# Patient Record
Sex: Female | Born: 1960 | Race: White | Hispanic: No | Smoking: Current every day smoker
Health system: Southern US, Community
[De-identification: ages and names within clinical notes are randomized; demographics above are authoritative.]

## PROBLEM LIST (undated history)

## (undated) DIAGNOSIS — I7300 Raynaud's syndrome without gangrene: Secondary | ICD-10-CM

## (undated) DIAGNOSIS — Z720 Tobacco use: Secondary | ICD-10-CM

## (undated) HISTORY — DX: Raynaud's syndrome without gangrene: I73.00

## (undated) HISTORY — DX: Tobacco use: Z72.0

---

## 1999-10-07 ENCOUNTER — Encounter: Payer: Self-pay | Admitting: Emergency Medicine

## 1999-10-07 ENCOUNTER — Emergency Department (HOSPITAL_COMMUNITY): Admission: EM | Admit: 1999-10-07 | Discharge: 1999-10-07 | Payer: Self-pay | Admitting: Emergency Medicine

## 2000-05-08 ENCOUNTER — Other Ambulatory Visit: Admission: RE | Admit: 2000-05-08 | Discharge: 2000-05-08 | Payer: Self-pay | Admitting: Obstetrics and Gynecology

## 2003-03-28 ENCOUNTER — Other Ambulatory Visit: Admission: RE | Admit: 2003-03-28 | Discharge: 2003-03-28 | Payer: Self-pay | Admitting: Obstetrics and Gynecology

## 2004-03-04 ENCOUNTER — Emergency Department (HOSPITAL_COMMUNITY): Admission: EM | Admit: 2004-03-04 | Discharge: 2004-03-04 | Payer: Self-pay | Admitting: Emergency Medicine

## 2006-10-01 ENCOUNTER — Emergency Department (HOSPITAL_COMMUNITY): Admission: EM | Admit: 2006-10-01 | Discharge: 2006-10-01 | Payer: Self-pay | Admitting: Emergency Medicine

## 2008-11-02 ENCOUNTER — Ambulatory Visit: Payer: Self-pay | Admitting: Psychiatry

## 2008-11-02 ENCOUNTER — Inpatient Hospital Stay (HOSPITAL_COMMUNITY): Admission: AD | Admit: 2008-11-02 | Discharge: 2008-11-07 | Payer: Self-pay | Admitting: Psychiatry

## 2010-10-17 LAB — URINALYSIS, ROUTINE W REFLEX MICROSCOPIC
Ketones, ur: NEGATIVE mg/dL
Nitrite: NEGATIVE
Protein, ur: NEGATIVE mg/dL
Specific Gravity, Urine: 1.018 (ref 1.005–1.030)

## 2010-10-17 LAB — COMPREHENSIVE METABOLIC PANEL
ALT: <8 U/L (ref 0–35)
AST: 16 U/L (ref 0–37)
Alkaline Phosphatase: 48 U/L (ref 39–117)
Calcium: 9.4 mg/dL (ref 8.4–10.5)
GFR calc Af Amer: >60 mL/min (ref >60)
Potassium: 4.2 mEq/L (ref 3.5–5.1)
Sodium: 137 mEq/L (ref 135–145)
Total Protein: 6.5 g/dL (ref 6.0–8.3)

## 2010-10-17 LAB — DRUGS OF ABUSE SCREEN W/O ALC, ROUTINE URINE
Barbiturate Quant, Ur: NEGATIVE
Benzodiazepines.: POSITIVE — AB
Cocaine Metabolites: NEGATIVE
Methadone: NEGATIVE
Opiate Screen, Urine: NEGATIVE

## 2010-10-17 LAB — CBC
MCHC: 33.8 g/dL (ref 30.0–36.0)
RBC: 4.66 MIL/uL (ref 3.87–5.11)
RDW: 13.6 % (ref 11.5–15.5)

## 2010-10-17 LAB — VITAMIN B12: Vitamin B-12: 450 pg/mL (ref 211–911)

## 2010-11-20 NOTE — H&P (Signed)
[name], [name]               ACCOUNT NO.:  [id number]   MEDICAL RECORD NO.:  [id number]          PATIENT TYPE:  IPS   LOCATION:  0503                          FACILITY:  [facility]   PHYSICIAN:  Geoffery Lyons, M.D.      DATE OF BIRTH:  [birth year]   DATE OF ADMISSION:  11/02/2008  DATE OF DISCHARGE:                       PSYCHIATRIC ADMISSION ASSESSMENT   TIME:  11:25 a.m.   IDENTIFYING INFORMATION:  This is a 50 year old female.  This is a  voluntary admission.   HISTORY OF PRESENT ILLNESS:  First inpatient psychiatric admission for  this 13 year old single mother, who reports having worsening depression  since around the Christmas holidays in 2009.  Then developing more  suicidal thoughts over the course of the past 2 weeks.  She has had  thoughts of possibly killing herself in a single car motor vehicle  accident and weighing the benefits and risks of other types of ways to  harm herself, but has made no attempts.  She has no history of prior  suicide attempts.  She feels that the onset of this depressive episode  occurred around December 2009 which was the 9 year anniversary of her  mother's death.  Since that time, she has been gradually feeling more  and more lonely with less energy.  Much more reclusive at home.  Appetite has been fairly good.  No weight loss, possibly a few pounds of  weight gain, but for the past month to 2 weeks not socializing, staying  at home all the time, very reclusive, no energy, lying in bed.  No  motivation to do anything.  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  She has been experiencing more anger and irritability lashing  out verbally at her 50 year old son and not able to tolerate minor  frustrations.  Suicidal thoughts now for 2 weeks.  No homicidal  thoughts.  No hallucinations.  She did relapse on cocaine one time about  6 days ago snorting powder after being  abstinent since May 2009.  Also  reports doing some binge drinking on the weekends in an attempt to  sedate herself.  Had her alprazolam prescription for 0.5 mg refilled and  is using 6-8 pills per day for the past 2-3 days when it is prescribed  up to q.i.d. p.r.n.   PAST PSYCHIATRIC HISTORY:  First inpatient psychiatric admission.  She  has a history of cannabis abuse daily for many years with intermittent  periods of abstinence usually up to about 30-45 days and then she will  resume use.  Had used cocaine and has been abstinent since Memorial Day  2009 until this past Friday about 6 days ago.  No history of suicide  attempts.  History of depression with onset 9 years ago when her mother  died and was treated at that time with either Zoloft or Wellbutrin.  She  is unsure.  No other psychotropics.  No history of suicide attempts.  No  prior admissions.  No prior treatment for substance abuse.   SOCIAL  HISTORY:  Divorced female with a 52 year old son at home.  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  Son's father is  involved in his life, but she receives no financial support for caring  for him.  Has no health insurance and does have significant financial  stressors.  No legal problems.   FAMILY HISTORY:  Mother and grandmother with undiagnosed and untreated  depression.   MEDICAL HISTORY:  Medical problems are none.   PAST MEDICAL HISTORY:  1. C. section.  2. Tonsillectomy and adenoidectomy.  3. Previously diagnosed with Raynaud's syndrome, but never treated.  4. No history of seizures, blackouts, brain injury or memory loss.   CURRENT MEDICATIONS:  Alprazolam 0.5 mg up to q.i.d. p.r.n.   DRUG ALLERGIES:  NONE.   PHYSICAL EXAMINATION:  GENERAL:  Physical exam was done here on the  unit.  It is unremarkable.  This is a medium built female, 5 feet 7  inches tall, 68 kg.  VITAL SIGNS:  Temperature 96.8, pulse 79, blood pressure 117/85,  respirations 16 and  clear.  MOTOR:  Within normal limits.  NEURO:  Nonfocal.   DIAGNOSTICS:  Pending.   MENTAL STATUS EXAM:  Reveals a fully alert female who does appear sad  with a constricted affect, good eye contact, tearful while talking about  her recent stressors and the loss of her mom.  Some guilt and shame  about being short-tempered with her son.  Insight is good.  Is able to  describe and relate her symptoms.  Gives a coherent history.  Thinking  is logical and coherent.  Mood is depressed.  No evidence of internal  distraction.  No signs of confusion or delirium.  Cognition is fully  preserved.  Insight good.  Impulse control and judgment within normal  limits.  Positive suicidal thoughts.  No homicidal thoughts.   AXIS I:  Major depressive episode, recurrent, severe.  Polysubstance  abuse.  AXIS II:  Deferred.  AXIS III:  No diagnosis.  AXIS IV:  Moderate chronic financial stressors and single parenting  stress.  AXIS V:  Current 48, past year not known.   PLAN:  The plan is to voluntarily admit her.  She recognizes that she  has been using the Xanax to sedate herself and doing some binge  drinking, so we will detox her with a Librium protocol, get some basic  labs and we will go from there.  She is enrolled in our dual diagnosis  program.      Claris Che A. Scott, N.P.      Geoffery Lyons, M.D.  Electronically Signed    MAS/MEDQ  D:  11/02/2008  T:  11/02/2008  Job:  [id number]

## 2010-11-23 NOTE — Discharge Summary (Signed)
NAMECHESNEY, SUARES               ACCOUNT NO.:  [id number]   MEDICAL RECORD NO.:  [id number]          PATIENT TYPE:  IPS   LOCATION:  0500                          FACILITY:  [facility]   PHYSICIAN:  Geoffery Lyons, M.D.      DATE OF BIRTH:  [birth year]   DATE OF ADMISSION:  11/02/2008  DATE OF DISCHARGE:  11/07/2008                               DISCHARGE SUMMARY   CHIEF COMPLAINT/HISTORY OF PRESENT ILLNESS:  This for the first  admission to [facility] for this 50 year old  female.  Reports worsening of the depression since Christmas with more  suicidal thoughts over the course of the past two weeks, with thoughts  of killing herself in a single car motor vehicle accident, and weighing  the benefits and risks for further types of ways to harm herself.  Has  made no attempts.  No previous suicide attempts.  Felt at the onset of  this depressive episode occurring in December 2009, which was the 9th  anniversary of her mother's death.  Since then she has been feeling more  and more lonely, with less energy, more reclusive.  Her appetite has  been holding.  The last two weeks, no socializing, isolating, with no  energy, lying in bed.  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  More anger,  irritability and lashing out verbally at her 69 year old son. Suicidal  thoughts for the last two weeks.  Did relapse on cocaine six days prior  to this admission, after being abstinent since May 2009.  Some binge  drinking on the weekends.   PAST PSYCHIATRIC HISTORY:  First time inpatient.  History of marijuana  abuse.  More recent use of cocaine, abstinent May 2009.  History of  depression, onset nine years ago, when her mother died.  At that time  treated with Zoloft or Wellbutrin.   PAST MEDICAL HISTORY:  Diagnosed with Raynaud's, never treated.   MEDICATIONS:  1. Xanax 0.5 mg, up to four times daily as needed.   PHYSICAL  EXAMINATION:  GENERAL:  Failed to show any acute findings.   LABORATORY WORK:  White blood cells 10.0, hemoglobin 14.6.  Sodium 137,  potassium 4.2, glucose 87.  SGOT 3.6, SGPT 16, total bilirubin 0.6.  TSH  2.374.  Drug screen positive for benzodiazepines.   MENTAL STATUS EXAM:  Reveals an alert, cooperative female.  Mood  depressed.  Affect depressed.  Good eye contact.  Tearful when talking  about her recent stressors and loss of her mother, and some guilt and  shame about being short-tempered with her son.  Thought processes  logical, coherent and relevant.  No active suicidal or homicidal ideas.  No delusions, no hallucinations.  Cognition well-preserved.   ADMISSION DIAGNOSES:  AXIS I:  1.  Major depressive disorder.  1. Cocaine abuse.  2. Marijuana abuse.  3. Alcohol abuse.  AXIS II:  No diagnosis.  AXIS IV:  Moderate.  On admission global assessment of functioning 35,  global assessment of functioning in the last year 60.  COURSE IN THE HOSPITAL:  She was admitted and started on individual and  group psychotherapy.  We detoxed with Librium.  We started Zoloft, which  she thought was the medication that she used successfully in the past.  Was wanting to give it a try.  Did endorse the underlying depression on  and off and the more recent suicidal ruminations.  Never in counseling  before.  There was a family history of depression.  The patient has  continued to open up and share all the series of events that resulted in  her being admitted.  Had a call  from the ex-husband, who was  supportive.  She was surprised, as in the past he has not been  supportive of her.  She did not want to read too much into it, as she  stated caring about him and getting back together will be something that  she would really like.  So we pursued the Zoloft.  We pursue coping  skills, cognitive behavioral therapy and sleeping through the night.  On  11/07/2008, she was in full contact with  reality.  There were no active  suicidal ideas, no hallucinations or delusions.   She was ready to go home.  She was committed to abstinence, to continue  to work on an outpatient basis and to continue to take the medications.   DISCHARGE DIAGNOSES:  AXIS I:  1.  Major depressive disorder.  1. Marijuana, cocaine and alcohol abuse.  AXIS II:  No diagnosis.  AXIS III:  No diagnosis.  AXIS IV:  Moderate.  On discharge global assessment of functioning was  55 to 60.   DISCHARGE MEDICATIONS:  1. Discharged on Zoloft 50 mg, one in the morning.  2. Ambien 10 mg, 1/2 to one at night for sleep.  3. Librium 25 mg, one daily for seven days, then discontinue.   FOLLOWUP:  To follow up with Dr. Lang Snow at [facility] and Malissa Hippo at [facility].      Geoffery Lyons, M.D.  Electronically Signed     IL/MEDQ  D:  11/18/2008  T:  11/18/2008  Job:  [id number]

## 2012-03-05 ENCOUNTER — Encounter (HOSPITAL_COMMUNITY): Payer: Self-pay

## 2012-03-05 ENCOUNTER — Emergency Department (HOSPITAL_COMMUNITY)
Admission: EM | Admit: 2012-03-05 | Discharge: 2012-03-06 | Disposition: A | Discharge status: Home / Self Care | Payer: Self-pay | Attending: Emergency Medicine | Admitting: Emergency Medicine

## 2012-03-05 ENCOUNTER — Emergency Department (HOSPITAL_COMMUNITY): Payer: Self-pay

## 2012-03-05 DIAGNOSIS — F17200 Nicotine dependence, unspecified, uncomplicated: Secondary | ICD-10-CM | POA: Insufficient documentation

## 2012-03-05 DIAGNOSIS — R109 Unspecified abdominal pain: Secondary | ICD-10-CM

## 2012-03-05 DIAGNOSIS — R1031 Right lower quadrant pain: Secondary | ICD-10-CM | POA: Insufficient documentation

## 2012-03-05 LAB — COMPREHENSIVE METABOLIC PANEL
ALT: 8 U/L (ref 0–35)
AST: 19 U/L (ref 0–37)
Albumin: 3.9 g/dL (ref 3.5–5.2)
Alkaline Phosphatase: 64 U/L (ref 39–117)
Chloride: 101 mEq/L (ref 96–112)
Creatinine, Ser: 1.08 mg/dL (ref 0.50–1.10)
Potassium: 4.2 mEq/L (ref 3.5–5.1)
Sodium: 138 mEq/L (ref 135–145)
Total Bilirubin: 0.4 mg/dL (ref 0.3–1.2)

## 2012-03-05 LAB — URINALYSIS, ROUTINE W REFLEX MICROSCOPIC
Glucose, UA: NEGATIVE mg/dL
Leukocytes, UA: NEGATIVE
Specific Gravity, Urine: 1.018 (ref 1.005–1.030)
pH: 6.5 (ref 5.0–8.0)

## 2012-03-05 LAB — CBC WITH DIFFERENTIAL/PLATELET
Eosinophils Absolute: 0.2 10*3/uL (ref 0.0–0.7)
Eosinophils Relative: 2 % (ref 0–5)
Lymphs Abs: 3.1 10*3/uL (ref 0.7–4.0)
MCH: 32.1 pg (ref 26.0–34.0)
MCV: 92.8 fL (ref 78.0–100.0)
Monocytes Relative: 7 % (ref 3–12)
Platelets: 250 10*3/uL (ref 150–400)
RBC: 4.45 MIL/uL (ref 3.87–5.11)

## 2012-03-05 LAB — PREGNANCY, URINE: Preg Test, Ur: NEGATIVE

## 2012-03-05 MED ORDER — HYDROCODONE-ACETAMINOPHEN 5-500 MG PO TABS: 1.0000 | ORAL_TABLET | Freq: Four times a day (QID) | ORAL | Status: AC | PRN

## 2012-03-05 MED ORDER — ONDANSETRON HCL 4 MG/2ML IJ SOLN
4.0000 mg | Freq: Once | INTRAMUSCULAR | Status: AC
  Administered 2012-03-05: 4 mg via INTRAVENOUS
  Filled 2012-03-05: qty 2

## 2012-03-05 MED ORDER — SODIUM CHLORIDE 0.9 % IV BOLUS (SEPSIS)
1000.0000 mL | Freq: Once | INTRAVENOUS | Status: AC
  Administered 2012-03-05: 1000 mL via INTRAVENOUS

## 2012-03-05 MED ORDER — IOHEXOL 300 MG/ML  SOLN
100.0000 mL | Freq: Once | INTRAMUSCULAR | Status: AC | PRN
  Administered 2012-03-05: 100 mL via INTRAVENOUS

## 2012-03-05 MED ORDER — HYDROMORPHONE HCL PF 1 MG/ML IJ SOLN
1.0000 mg | Freq: Once | INTRAMUSCULAR | Status: AC
  Administered 2012-03-05: 1 mg via INTRAVENOUS
  Filled 2012-03-05: qty 1

## 2012-03-05 NOTE — ED Notes (Signed)
Pt c/o pain at RLQ pain, went to OB/GYN and didn't find anything with Ultrasound, referred here for rule out appendicitis. Pt alert, oriented.

## 2012-03-05 NOTE — ED Provider Notes (Signed)
History     CSN: [id number]  Arrival date & time 03/05/12  1642   First MD Initiated Contact with Patient 03/05/12 2015      Chief Complaint  Patient presents with  . Abdominal Pain    RLQ    (Consider location/radiation/quality/duration/timing/severity/associated sxs/prior treatment) HPI Pt presents with right lower abdominal pain.  She states pain is constant and worsening over the past several days.  No fever or vomiting.  Pain described as soreness, worse when she moves.  She was seen today prior to this ED visit by her gynecologist and had a pelvic exam and pelvic ultrasound.  They did not find any abnormalities.  She denies dysuria, no changes in stools.  There are no other associated systemic symptoms, there are no other alleviating or modifying factors.   History reviewed. No pertinent past medical history.  Past Surgical History  Procedure Date  . Cesarean section     No family history on file.  History  Substance Use Topics  . Smoking status: Current Everyday Smoker -- 1.0 packs/day  . Smokeless tobacco: Not on file  . Alcohol Use: Yes    OB History    Grav Para Term Preterm Abortions TAB SAB Ect Mult Living                  Review of Systems ROS reviewed and all otherwise negative except for mentioned in HPI  Allergies  Review of patient's allergies indicates no known allergies.  Home Medications   Current Outpatient Rx  Name Route Sig Dispense Refill  . ACETAMINOPHEN 500 MG PO TABS Oral Take 1,000 mg by mouth every 6 (six) hours as needed. pain    . BUPROPION HCL 100 MG PO TABS Oral Take 200 mg by mouth daily.    . IBUPROFEN 200 MG PO TABS Oral Take 600 mg by mouth every 6 (six) hours as needed. pain    . ADULT MULTIVITAMIN W/MINERALS CH Oral Take 1 tablet by mouth daily.    . SERTRALINE HCL 100 MG PO TABS Oral Take 200 mg by mouth daily.    Marland Kitchen HYDROCODONE-ACETAMINOPHEN 5-500 MG PO TABS Oral Take 1-2 tablets by mouth every 6 (six) hours as needed for  pain. 15 tablet 0    BP 117/69  Pulse 78  Temp 97.8 F (36.6 C) (Oral)  Resp 16  SpO2 95% Vitals reviewed Physical Exam Physical Examination: General appearance - alert, well appearing, and in no distress Mental status - alert, oriented to person, place, and time Eyes - no scleral icterus or conjunctival injection Mouth - mucous membranes moist, pharynx normal without lesions Chest - clear to auscultation, no wheezes, rales or rhonchi, symmetric air entry Heart - normal rate, regular rhythm, normal S1, S2, no murmurs, rubs, clicks or gallops Abdomen - soft, RLQ tenderness- no gaurding or rebound, nondistended, no masses or organomegaly, nabs Back exam - full range of motion, no tenderness, palpable spasm or pain on motion, no CVA tenderness Extremities - peripheral pulses normal, no pedal edema, no clubbing or cyanosis Skin - normal coloration and turgor, no rashes  ED Course  Procedures (including critical care time)  Labs Reviewed  COMPREHENSIVE METABOLIC PANEL - Abnormal; Notable for the following:    GFR calc non Af Amer 59 (*)     GFR calc Af Amer 68 (*)     All other components within normal limits  URINALYSIS, ROUTINE W REFLEX MICROSCOPIC  PREGNANCY, URINE  CBC WITH DIFFERENTIAL  LAB REPORT -  SCANNED   No results found.   1. Abdominal pain       MDM  Pt presenting with c/o right lower abdominal pain.  States she was seen at gynecologist today- had pelvic exam and pelvic ultrasound in their office- was referred to ED for appendicitis rule out.  No RBCs in urine to suggest renal stone, CT scan shows normal appendix.  No finding to explain RLQ pain.  Pt advised of findings in liver- recommended RUQ ultrasound.  Discharged with strict return precautions.  Pt agreeable with plan.        Ethelda Chick, MD 03/09/12 1750

## 2012-03-05 NOTE — ED Notes (Signed)
Ct called, informed them pt finished contrast.

## 2012-03-05 NOTE — ED Notes (Signed)
Pt ambulated back from CT.

## 2012-03-05 NOTE — ED Notes (Signed)
Pt reports RLQ pain. Pt states she went to her

## 2012-03-05 NOTE — ED Notes (Signed)
Pt ambulated to CT

## 2012-03-12 ENCOUNTER — Other Ambulatory Visit: Payer: Self-pay | Admitting: Family Medicine

## 2012-03-12 DIAGNOSIS — D1803 Hemangioma of intra-abdominal structures: Secondary | ICD-10-CM

## 2012-03-13 ENCOUNTER — Other Ambulatory Visit: Payer: Self-pay

## 2014-09-14 ENCOUNTER — Ambulatory Visit (INDEPENDENT_AMBULATORY_CARE_PROVIDER_SITE_OTHER): Payer: Managed Care, Other (non HMO)

## 2014-09-14 ENCOUNTER — Encounter: Payer: Self-pay | Admitting: Podiatry

## 2014-09-14 ENCOUNTER — Ambulatory Visit (INDEPENDENT_AMBULATORY_CARE_PROVIDER_SITE_OTHER): Payer: Managed Care, Other (non HMO) | Admitting: Podiatry

## 2014-09-14 VITALS — BP 96/61 | HR 47 | Resp 18

## 2014-09-14 DIAGNOSIS — Q6689 Other  specified congenital deformities of feet: Secondary | ICD-10-CM

## 2014-09-14 DIAGNOSIS — M7740 Metatarsalgia, unspecified foot: Secondary | ICD-10-CM

## 2014-09-14 DIAGNOSIS — Q72899 Other reduction defects of unspecified lower limb: Secondary | ICD-10-CM

## 2014-09-14 DIAGNOSIS — R52 Pain, unspecified: Secondary | ICD-10-CM

## 2014-09-14 DIAGNOSIS — B351 Tinea unguium: Secondary | ICD-10-CM

## 2014-09-14 DIAGNOSIS — M2010 Hallux valgus (acquired), unspecified foot: Secondary | ICD-10-CM

## 2014-09-14 NOTE — Progress Notes (Signed)
   Subjective:    Patient ID: [name], female    DOB: November 02, 1960, 54 y.o.   MRN: [id number]  HPI  54 year old female presents the office today with complaints of bilateral bunions, pain in the ball of her foot as well as intermittent numbness to her toes. She states that she stands on concrete floors working 8 hours per day wearing steel toed shoes. She states that she has pain after standing for prolonged periods of time. She denies any recent injury or trauma to her lower extremities and she denies any swelling or redness overlying the area. She also states that the nails need to be trimmed as they are thickened discolored and she is inquiring about possible nail fungus. She states the nails and not painful and she denies any redness or drainage from around the sites. She's had no other treatments for her foot conditions. No other complaints at this time.   Review of Systems  Respiratory: Positive for cough.   Endocrine: Positive for cold intolerance.  Musculoskeletal:       Joint pain  Skin: Positive for rash.  All other systems reviewed and are negative.      Objective:   Physical Exam AAO 3, NAD DP/PT pulses palpable, CRT less than 3 seconds Protective sensation intact with Simms Weinstein monofilament, vibratory sensation intact, Achilles tendon reflex intact. There is a decrease in medial arch height upon weightbearing bilaterally. 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 There is plantarflexed first metatarsals bilaterally. There is prominent metatarsal heads plantarly with atrophy of the fat pad. On the right foot there is significant prominence of the third and fifth metatarsal heads due to the shortened fourth metatarsal and there is a hyperkeratotic lesion overlying the second and third metatarsal heads on the right foot. There is no area pinpoint bony tenderness or pain with vibratory sensation to bilateral lower extremities. There is no  pain with MTPJ range of motion or ankle/subtalar joint range of motion. There is no pain upon compression of the interdigital space and there is no palpable neuroma identified. No pain with medial to lateral compression of the metatarsals. MMT 5/5, ROM WNL. There is no overlying edema, erythema, increase in warmth to bilateral lower extremities. No open lesions or other pre-ulcerative lesions identified bilaterally. There is no evidence of athlete's foot at this time. Nails are slightly dystrophic, discolored and elongated. There is no tenderness to palpation along the nails there is no swelling erythema or drainage. No pain with calf compression, swelling, warmth, erythema.       Assessment & Plan:  54 year old female with bilateral forefoot pain, numbness -X-rays were obtained and reviewed with the patient. -Treatment options discussed the patient include alternatives, risks, complications. -Discussed the patient in detail likely etiology of her symptoms. Numbness is likely due to biomechanical in nature. I discussed various offloading devices and orthotics to help support her foot type. A prescription for orthotics given to the patient for biotech. -Metatarsal pads were dispensed to the patient and applied. -Hyperkeratotic lesion sharply debrided 1 without complication/bleeding. -Nail sharply debrided 10 without complication/bleeding. Discussed various treatment options for onychomycosis. -Follow-up after orthotics are made or if she is unable to get them. In the meantime encouraged to call the office with any questions, concerns, change in symptoms. Follow-up with PCP for other issues mentioned in the ROS.

## 2016-06-24 ENCOUNTER — Ambulatory Visit: Payer: Managed Care, Other (non HMO) | Admitting: Podiatry

## 2016-07-04 ENCOUNTER — Ambulatory Visit (INDEPENDENT_AMBULATORY_CARE_PROVIDER_SITE_OTHER): Payer: Managed Care, Other (non HMO) | Admitting: Podiatry

## 2016-07-04 ENCOUNTER — Encounter: Payer: Self-pay | Admitting: Podiatry

## 2016-07-04 ENCOUNTER — Ambulatory Visit (INDEPENDENT_AMBULATORY_CARE_PROVIDER_SITE_OTHER): Payer: Managed Care, Other (non HMO)

## 2016-07-04 DIAGNOSIS — I999 Unspecified disorder of circulatory system: Secondary | ICD-10-CM | POA: Diagnosis not present

## 2016-07-04 DIAGNOSIS — M79672 Pain in left foot: Secondary | ICD-10-CM | POA: Diagnosis not present

## 2016-07-04 NOTE — Progress Notes (Signed)
Subjective:     Patient ID: [name], female   DOB: Sep 07, 1960, 55 y.o.   MRN: [id number]  HPI patient states that she stubbed her second toe 10 months ago left and it continues to her and she's having discomfort when she tries to wear shoes and she also complains of cramping in her leg during the day and at night which is gradually gotten worse and she is a history of smoker approximately 1/2-1 pack per day   Review of Systems     Objective:   Physical Exam Neurological status was intact but I did have difficulty finding and measuring pulses bilateral both DP and PT. The second digit left is discolored and there is no active ulceration but it does appear to be traumatized and appears to have vascular issues with patient also having history of raynaulds    Assessment:     Traumatized second digit left with possibility that vascular disease as a compromising factor with this condition    Plan:     H&P x-ray reviewed and I'm sending her for vascular evaluations before initiating treatment. I did apply padding the second toe to try to reduce some of the pressure against it and we are scheduling her for vascular evaluation  X-ray report was negative for signs of fracture did indicate structural bunion with no indications of trauma to the second digit

## 2016-07-23 ENCOUNTER — Ambulatory Visit: Payer: Self-pay | Admitting: Cardiovascular Disease

## 2016-07-30 ENCOUNTER — Other Ambulatory Visit: Payer: Self-pay | Admitting: Podiatry

## 2016-07-30 DIAGNOSIS — R0989 Other specified symptoms and signs involving the circulatory and respiratory systems: Secondary | ICD-10-CM

## 2016-08-02 ENCOUNTER — Ambulatory Visit (HOSPITAL_COMMUNITY)
Admission: RE | Admit: 2016-08-02 | Discharge: 2016-08-02 | Disposition: A | Discharge status: Home / Self Care | Payer: Managed Care, Other (non HMO) | Source: Ambulatory Visit | Attending: Cardiology | Admitting: Cardiology

## 2016-08-02 DIAGNOSIS — R0989 Other specified symptoms and signs involving the circulatory and respiratory systems: Secondary | ICD-10-CM | POA: Insufficient documentation

## 2016-08-02 DIAGNOSIS — I999 Unspecified disorder of circulatory system: Secondary | ICD-10-CM | POA: Diagnosis not present

## 2016-08-06 ENCOUNTER — Ambulatory Visit (INDEPENDENT_AMBULATORY_CARE_PROVIDER_SITE_OTHER): Payer: Managed Care, Other (non HMO) | Admitting: Cardiovascular Disease

## 2016-08-06 ENCOUNTER — Encounter: Payer: Self-pay | Admitting: Cardiovascular Disease

## 2016-08-06 VITALS — BP 138/84 | HR 54 | Ht 68.0 in | Wt 115.8 lb

## 2016-08-06 DIAGNOSIS — Z720 Tobacco use: Secondary | ICD-10-CM

## 2016-08-06 DIAGNOSIS — I7300 Raynaud's syndrome without gangrene: Secondary | ICD-10-CM | POA: Diagnosis not present

## 2016-08-06 MED ORDER — ASPIRIN EC 81 MG PO TBEC: 81.0000 mg | DELAYED_RELEASE_TABLET | Freq: Every day | ORAL | Status: AC

## 2016-08-06 MED ORDER — AMLODIPINE BESYLATE 2.5 MG PO TABS: 2.5000 mg | ORAL_TABLET | Freq: Every day | ORAL | 3 refills | Status: DC

## 2016-08-06 NOTE — Progress Notes (Signed)
Cardiology Office Note   Date:  08/06/2016   ID:  [name], DOB [birth year], MRN [id number]  PCP:  Shirline Frees, MD  Cardiologist:  New  Chief Complaint  Patient presents with  . New Patient (Initial Visit)      History of Present Illness: [name] is a 56 y.o. female who was referred by Dr. Paulla Dolly  for evaluation of possible peripheral arterial disease. She has no prior cardiac history and has no history of diabetes, hypertension or hyperlipidemia. She has prolonged history of tobacco use and currently smokes half a pack per day and has been doing so for at least 35 years. She also describes prolonged history of Raynaud's disease affecting both hands and feet for at least 10 years. Her symptoms typically worsened significantly in the wintertime. She stubbed her second left toe about one year ago which has not healed. Over the last 2 months she noticed dark and black discoloration. She denies any leg claudication or ulceration. She denies any chest pain or shortness of breath. She underwent noninvasive vascular evaluation in our office which showed normal ABI and triphasic waveforms with normal toe pressure. However, bilateral lower digit PPG's were abnormal.      Past Medical History:  Diagnosis Date  . Raynaud disease   . Tobacco use     Past Surgical History:  Procedure Laterality Date  . CESAREAN SECTION       Current Outpatient Prescriptions  Medication Sig Dispense Refill  . acetaminophen (TYLENOL) 500 MG tablet Take 1,000 mg by mouth every 6 (six) hours as needed. pain    . ibuprofen (ADVIL,MOTRIN) 200 MG tablet Take 600 mg by mouth every 6 (six) hours as needed. pain    . Multiple Vitamin (MULTIVITAMIN WITH MINERALS) TABS Take 1 tablet by mouth daily.    Marland Kitchen amLODipine (NORVASC) 2.5 MG tablet Take 1 tablet (2.5 mg total) by mouth daily. 30 tablet 3  . aspirin EC 81 MG tablet Take 1 tablet (81 mg total) by mouth daily.     No current  facility-administered medications for this visit.     Allergies:   Patient has no known allergies.    Social History:  The patient  reports that she has been smoking.  She has been smoking about 1.00 pack per day. She has never used smokeless tobacco. She reports that she drinks alcohol. She reports that she uses drugs, including Marijuana.   Family History:  The patient's Family history is negative for coronary artery disease or sudden death.  ROS:  Please see the history of present illness.   Otherwise, review of systems are positive for none.   All other systems are reviewed and negative.    PHYSICAL EXAM: VS:  BP 138/84   Pulse (!) 54   Ht 5\' 8"  (1.727 m)   Wt 115 lb 12.8 oz (52.5 kg)   BMI 17.61 kg/m  , BMI Body mass index is 17.61 kg/m. GEN: Well nourished, well developed, in no acute distress  HEENT: normal  Neck: no JVD, carotid bruits, or masses Cardiac: RRR; no murmurs, rubs, or gallops,no edema  Respiratory:  clear to auscultation bilaterally, normal work of breathing GI: soft, nontender, nondistended, + BS MS: no deformity or atrophy  Skin: warm and dry, no rash Neuro:  Strength and sensation are intact Psych: euthymic mood, full affect Vascular: Radial pulses normal bilaterally. Femoral pulses normal bilaterally. Posterior tibial and dorsalis pedis is normal bilaterally. There is bluish discoloration  of fingers and toes with dark discoloration of the left second toe   EKG:  EKG is ordered today. The ekg ordered today demonstrates sinus bradycardia with no significant ST or T wave changes. No prior infarcts.   Recent Labs: No results found for requested labs within last 8760 hours.    Lipid Panel No results found for: CHOL, TRIG, HDL, CHOLHDL, VLDL, LDLCALC, LDLDIRECT    Wt Readings from Last 3 Encounters:  08/06/16 115 lb 12.8 oz (52.5 kg)       No flowsheet data found.    ASSESSMENT AND PLAN:  1.  Raynaud's disease: The patient's noninvasive  vascular imaging showed normal ABI, waveforms and toe pressure. It is possible that Raynaud's disease is interfering with the proper healing of the left second toe. I added low-dose aspirin 81 mg once daily and amlodipine 2.5 mg once daily. I advised her to avoid cold weather and keep hands and toes warm. Angiography should be left as a last resort although I doubt there would be much benefit from this given that her disease is mostly microvascular.  2. Tobacco use: I discussed with her the importance of smoking cessation. She reports being under significant stress at the present time and not able to quit.    Disposition:   FU with me in 1 month  Signed,  Kathlyn Sacramento, MD  08/06/2016 1:26 PM    [place]

## 2016-08-06 NOTE — Patient Instructions (Signed)
Medication Instructions:  Your physician has recommended you make the following change in your medication:  1. START Aspirin 81mg  take one tablet by mouth daily 2. START Amlodipine 2.5mg  take one tablet by mouth daily  Labwork: No new orders.   Testing/Procedures: No new orders.   Follow-Up: Your physician recommends that you schedule a follow-up appointment in: 1 MONTH with Dr Fletcher Anon    Any Other Special Instructions Will Be Listed Below (If Applicable).     If you need a refill on your cardiac medications before your next appointment, please call your pharmacy.

## 2016-08-22 ENCOUNTER — Telehealth: Payer: Self-pay | Admitting: Cardiovascular Disease

## 2016-08-22 ENCOUNTER — Encounter: Payer: Self-pay | Admitting: *Deleted

## 2016-08-22 NOTE — Telephone Encounter (Signed)
New Message     Pt is calling regarding open wounds on her toes, she had to leave work due to pain in feet,

## 2016-08-22 NOTE — Telephone Encounter (Signed)
Letter generated and faxed to [facility] at [phone].  He will look for letter.

## 2016-08-22 NOTE — Telephone Encounter (Signed)
Spoke w patient, she voiced she's very thankful for Dr. Tyrell Antonio help w this. She's going to try to arrange a visit w podiatry and is calling them today or tomorrow. Asked if agreeable to have work note from today to Tuesday, since she was unable to stay on her feet more than 2 hrs today. Aware I'll need Dr. Tyrell Antonio signature on the note, but since she has transportation limitations this could probably be faxed from [facility] to [facility] office for her to pick up.

## 2016-08-22 NOTE — Telephone Encounter (Signed)
Fax received, provided for pickup at [facility] office front desk. I've informed patient, who voiced thanks. Aware to follow Dr. Tyrell Antonio instructions on podiatry visit and f/u w [place] as scheduled. Aware she may call if new concerns.  Thank you [facility] team!

## 2016-08-22 NOTE — Telephone Encounter (Signed)
Pt is on a blood thinner that Dr Fletcher Anon started her on 08-06-16. This was to address the Raynauds Disease.She had two toes with black spots,now the spots are open wounds. Pt says she needs to be seem.Dr Fletcher Anon has an opening on Tuesday,thought pt might to be seen sooner.

## 2016-08-22 NOTE — Telephone Encounter (Signed)
  Pt informs me she left work early today due to throbbing pain. She's unable to do her job seated due to nature of her occupation. Asking for work note if possible. Aware she may need to be seen by provider for this.   Currently no scheduled f/u w podiatry. Does she need to follow up with them first?

## 2016-08-22 NOTE — Telephone Encounter (Signed)
Pt of Dr. Fletcher Anon referred recently from podiatry, seen in January. Was put on ASA 81mg  and amlodipine  Has scheduled 1 month f/u in march  Has had nonhealing black spots on 2 toes for approx 1 year. In past 3 days, she states she has small wounds in these spots. States clear liquid coming from one after end of her work shift (she works in standing position mostly). No discolored exudate, bleeding, hot spots.  When initially seen by podiatry, she got a toe sleeve (as she refers to it, a "toe condom") - asked if she should still wear this?  She applied bandaids to wounds.  I advised use of neosporin cream before application of bandages to keep site clean. informed her I would see if we can bring in for appt.  Currently, upcoming schedule at [facility] full. Ok to [place] if needed? I offered to see if she could be seen in [place], she states she cannot drive that far.  Ok to leave msg and arrange visit pt very thankful for advice given and anything that can be arranged.

## 2016-08-22 NOTE — Telephone Encounter (Signed)
I am fine with giving her a work note for few days. She should schedule a follow-up with podiatry and keep her follow-up with me as scheduled.

## 2016-08-22 NOTE — Telephone Encounter (Signed)
See prior note. Concerns relayed to Dr. Fletcher Anon.

## 2016-08-23 ENCOUNTER — Encounter: Payer: Self-pay | Admitting: Podiatry

## 2016-08-23 ENCOUNTER — Ambulatory Visit (INDEPENDENT_AMBULATORY_CARE_PROVIDER_SITE_OTHER): Payer: Managed Care, Other (non HMO) | Admitting: Podiatry

## 2016-08-23 DIAGNOSIS — M79672 Pain in left foot: Secondary | ICD-10-CM | POA: Diagnosis not present

## 2016-08-23 DIAGNOSIS — I999 Unspecified disorder of circulatory system: Secondary | ICD-10-CM

## 2016-08-23 MED ORDER — CEPHALEXIN 500 MG PO CAPS: 500.0000 mg | ORAL_CAPSULE | Freq: Two times a day (BID) | ORAL | 1 refills | Status: DC

## 2016-08-24 NOTE — Progress Notes (Signed)
Subjective:     Patient ID: [name], female   DOB: Oct 10, 1960, 56 y.o.   MRN: [id number]  HPI patient continues to have problems with the second digit left over right with history of probable frostbite to the digits with vascular studies that showed adequate flow   Review of Systems     Objective:   Physical Exam Neurovascular status unchanged with patient noted to have distal irritation of the second digits left over right with probable cold exposure and irritation of the tissue secondary to trauma    Assessment:     Reviewed vascular status advised on soaks therapy open toed shoes and did as precautionary measure discuss antibiotics with patient noted to have probable localized necrotic tissue left over right    Plan:     H&P and reviewed the condition. Continue soaks elevation utilization of open toed shoes protection of the digits and antibiotics. If they were to get worse she'll be seen back and I did discuss the possibilities of they don't heel that ultimately this could require amputation depending on her pain level

## 2016-08-26 ENCOUNTER — Telehealth: Payer: Self-pay | Admitting: Cardiovascular Disease

## 2016-08-26 ENCOUNTER — Other Ambulatory Visit: Payer: Self-pay | Admitting: Podiatry

## 2016-08-26 ENCOUNTER — Telehealth: Payer: Self-pay | Admitting: *Deleted

## 2016-08-26 DIAGNOSIS — M79672 Pain in left foot: Secondary | ICD-10-CM

## 2016-08-26 DIAGNOSIS — I999 Unspecified disorder of circulatory system: Secondary | ICD-10-CM

## 2016-08-26 DIAGNOSIS — S91109A Unspecified open wound of unspecified toe(s) without damage to nail, initial encounter: Secondary | ICD-10-CM

## 2016-08-26 NOTE — Telephone Encounter (Addendum)
Pt states she has black toes, she has Raynauds and requests to be referred to [facility] Rheumatology. I told pt that if her toes were black Dr. Paulla Dolly would possible want a referral to vascular. Pt states she had been referred to vascular and at last visit Dr. Paulla Dolly stated there was nothing else he could do for her. I told pt I would put in note for the request to Dr. Paulla Dolly and call her again. I informed pt of Dr. Mellody Drown referral and suggested with the urgency she was requesting the referral she may want to see Dr. Gavin Pound [facility]. Pt states she has to stay in-network and suggested referral to [facility], Rheumatology. I told her I would fax her referral to their new pt coordinator. Referral, clinicals and demographics faxed to [facility]. 08/27/2016-Pt states she has been called by [facility] and she set up an appt.

## 2016-08-26 NOTE — Telephone Encounter (Signed)
Reviewed chart. Patient got this referral handled by podiatry.

## 2016-08-26 NOTE — Telephone Encounter (Signed)
Pt would like for Dr Fletcher Anon to refer her to a Rheumatologist at [facility] because of her ins.  [facility] is in her network.

## 2016-08-26 NOTE — Telephone Encounter (Signed)
I spoke with radiologist Dr. Jacqualyn Posey who will see her and determine if any vascular issues exist that can be improved and may be able to help her. He would see her in his clinic for evaluation

## 2016-08-26 NOTE — Telephone Encounter (Signed)
Routed to Dr. Fletcher Anon for any recommendations. Will f/u w patient.

## 2016-08-26 NOTE — Telephone Encounter (Signed)
"  Dr. Earleen Newport said he was just over there and spoke to Dr. Paulla Dolly.  He said Dr. Paulla Dolly was going to refer over Margarito Liner.  Can you send the orders?  Fax them to [phone]."  I will get Marcy Siren to send it to you.

## 2016-08-26 NOTE — Telephone Encounter (Incomplete)
We can order more extensive

## 2016-09-10 ENCOUNTER — Ambulatory Visit (INDEPENDENT_AMBULATORY_CARE_PROVIDER_SITE_OTHER): Payer: Managed Care, Other (non HMO) | Admitting: Cardiovascular Disease

## 2016-09-10 ENCOUNTER — Ambulatory Visit
Admission: RE | Admit: 2016-09-10 | Discharge: 2016-09-10 | Disposition: A | Discharge status: Home / Self Care | Payer: Managed Care, Other (non HMO) | Source: Ambulatory Visit | Attending: Podiatry | Admitting: Podiatry

## 2016-09-10 VITALS — BP 116/70 | HR 65 | Ht 68.0 in | Wt 117.2 lb

## 2016-09-10 DIAGNOSIS — I7300 Raynaud's syndrome without gangrene: Secondary | ICD-10-CM

## 2016-09-10 DIAGNOSIS — S91109A Unspecified open wound of unspecified toe(s) without damage to nail, initial encounter: Secondary | ICD-10-CM

## 2016-09-10 DIAGNOSIS — Z720 Tobacco use: Secondary | ICD-10-CM | POA: Diagnosis not present

## 2016-09-10 HISTORY — PX: IR RADIOLOGIST EVAL & MGMT: IMG5224

## 2016-09-10 MED ORDER — AMLODIPINE BESYLATE 5 MG PO TABS: 5.0000 mg | ORAL_TABLET | Freq: Every day | ORAL | 3 refills | Status: DC

## 2016-09-10 NOTE — Progress Notes (Signed)
Cardiology Office Note   Date:  09/10/2016   ID:  [name], DOB 25-Jun-1961, MRN [id number]  PCP:  Shirline Frees, MD  Cardiologist:  New  Chief Complaint  Patient presents with  . Follow-up      History of Present Illness: [name] is a 56 y.o. female who is here today for a follow-up visit regarding Raynaud's disease. She has no prior cardiac history and has no history of diabetes, hypertension or hyperlipidemia. She has prolonged history of tobacco use and currently smokes half a pack per day and has been doing so for at least 35 years. She had Raynaud's disease affecting both hands and feet for at least 10 years. Her symptoms typically worsened significantly in the wintertime. The patient had dark discoloration of second left toe after an injury. She underwent noninvasive vascular evaluation in our office which showed normal ABI and triphasic waveforms with normal toe pressure. However, bilateral lower digit PPG's were abnormal. It was felt that Raynaud's disease was contributing to this. I added small dose aspirin 81 mg once daily and amlodipine 2.5 mg once daily. I highly recommended that she quit smoking. Since her last visit, her symptoms initially worsened and then improved again. She saw Dr.Syed for rheumatology consultation and had labs are not back yet. She decreased tobacco use to 3 cigarettes a day and is trying to quit smoking.    Past Medical History:  Diagnosis Date  . Raynaud disease   . Tobacco use     Past Surgical History:  Procedure Laterality Date  . CESAREAN SECTION       Current Outpatient Prescriptions  Medication Sig Dispense Refill  . acetaminophen (TYLENOL) 500 MG tablet Take 1,000 mg by mouth every 6 (six) hours as needed. pain    . amLODipine (NORVASC) 2.5 MG tablet Take 1 tablet (2.5 mg total) by mouth daily. 30 tablet 3  . aspirin EC 81 MG tablet Take 1 tablet (81 mg total) by mouth daily.    Marland Kitchen ibuprofen (ADVIL,MOTRIN) 200  MG tablet Take 600 mg by mouth every 6 (six) hours as needed. pain    . Multiple Vitamin (MULTIVITAMIN WITH MINERALS) TABS Take 1 tablet by mouth daily.    Marland Kitchen PARoxetine (PAXIL) 20 MG tablet Take 20 mg by mouth daily.     No current facility-administered medications for this visit.     Allergies:   Patient has no known allergies.    Social History:  The patient  reports that she has been smoking.  She has been smoking about 1.00 pack per day. She has never used smokeless tobacco. She reports that she drinks alcohol. She reports that she uses drugs, including Marijuana.   Family History:  The patient's Family history is negative for coronary artery disease or sudden death.  ROS:  Please see the history of present illness.   Otherwise, review of systems are positive for none.   All other systems are reviewed and negative.    PHYSICAL EXAM: VS:  BP 116/70   Pulse 65   Ht 5\' 8"  (1.727 m)   Wt 117 lb 3.2 oz (53.2 kg)   BMI 17.82 kg/m  , BMI Body mass index is 17.82 kg/m. GEN: Well nourished, well developed, in no acute distress  HEENT: normal  Neck: no JVD, carotid bruits, or masses Cardiac: RRR; no murmurs, rubs, or gallops,no edema  Respiratory:  clear to auscultation bilaterally, normal work of breathing GI: soft, nontender, nondistended, + BS  MS: no deformity or atrophy  Skin: warm and dry, no rash Neuro:  Strength and sensation are intact Psych: euthymic mood, full affect Vascular: Radial pulses normal bilaterally. Femoral pulses normal bilaterally. Posterior tibial and dorsalis pedis is normal bilaterally. There is bluish discoloration of fingers and toes with dark discoloration of the left and right second toe   EKG:  EKG is not ordered today.   Recent Labs: No results found for requested labs within last 8760 hours.    Lipid Panel No results found for: CHOL, TRIG, HDL, CHOLHDL, VLDL, LDLCALC, LDLDIRECT    Wt Readings from Last 3 Encounters:  09/10/16 117 lb 3.2 oz  (53.2 kg)  09/10/16 113 lb (51.3 kg)  08/06/16 115 lb 12.8 oz (52.5 kg)       No flowsheet data found.    ASSESSMENT AND PLAN:  1.  Raynaud's disease: The patient's noninvasive vascular imaging showed normal ABI, waveforms and toe pressure. It is possible that Raynaud's disease is interfering with the proper healing of the second toe. The cold weather is worsening her symptoms and she is trying to keep her feet warm. I elected to increase amlodipine to 5 mg once daily. I think it helps to avoid exposure to cold weather altogether until she is completely healed. Thus, I extended her time off work until April 9. If no improvement in one month, then I plan on proceeding with angiography to exclude macrovascular disease. The chance of that is likely low.  2. Tobacco use: I again discussed with her the importance of complete smoking cessation. This is definitely contributing to some of her symptoms.   Disposition:   FU with me in 1 month  Signed,  Kathlyn Sacramento, MD  09/10/2016 10:54 AM    [place]

## 2016-09-10 NOTE — Consult Note (Signed)
Chief Complaint: I have painful toes with an ulcer.   Referring Physician(s): Dr. Ila Mcgill  History of Present Illness: [name] is a 56 year-old female presenting to [facility] clinic today as a scheduled consultation, kindly referred by Dr. Paulla Dolly of [facility], for evaluation for a complaint of painful toes in the setting of known Raynaud's disease.   [name] tells me her diagnosis of Raynaud's was made approximately 20 years ago, and her symptoms have been progressively worsening over time.  Before December 2017, when she developed exquisitely sensitive second toe of the left foot, she had primarily been dealing with cold and numbness of her extremities.  The symptoms have traditionally been worse during the winter months, but also during periods of high anxiety and also during the summer in rooms with very cold air conditioning.    Before now she has never had a wound develop.    She initiated therapy recently with Norvasc/amlodipine, prescribed by her cardiologist, Dr. Fletcher Anon.  She feels that she has not been getting any appreciable relief of symptoms.    Her main complaint now is the pain she experiences while standing, which has been limiting her work.  She has been out of work for 2.5 weeks.   In December of 2017, she noticed an ulcer develop on her second left toe, with severe purplish hue.  Since then, she has also had a wound develop in the second toe of the right foot, with similar discoloration.  She has no other wounds.    She denies any associated symptoms such as soft tissue nodules (calcinosis), esophageal dysmotility, or "tight skin" changes (scleroderma).  She has no direct relatives with Raynaud's syndrome, though reports her aunt has a diagnosis of Lupus.    Regarding her smoking, she tells me she started in her twenties, with about 1ppd history.   She has never attempted to quit before now, and is now in the process of  quitting.  She tells me she is down to 3-4 cigarettes per day, and has been using nicotine patch and gum/suckers.    Cardiovascular risk factors include: Smoking.     She has no prior surgeries.   Social history includes: 30 pack-year history.   Podiatry: Dr. Tana Felts Cardiologist: Dr. Kathlyn Sacramento Rheumatology: Dr. Sabino Niemann PCP: Dr. Azalia Bilis    Past Medical History:  Diagnosis Date  . Raynaud disease   . Tobacco use     Past Surgical History:  Procedure Laterality Date  . CESAREAN SECTION      Allergies: Patient has no known allergies.  Medications: Prior to Admission medications   Medication Sig Start Date End Date Taking? Authorizing Provider  acetaminophen (TYLENOL) 500 MG tablet Take 1,000 mg by mouth every 6 (six) hours as needed. pain   Yes Historical Provider, MD  amLODipine (NORVASC) 2.5 MG tablet Take 1 tablet (2.5 mg total) by mouth daily. 08/06/16 11/04/16 Yes Wellington Hampshire, MD  aspirin EC 81 MG tablet Take 1 tablet (81 mg total) by mouth daily. 08/06/16  Yes Wellington Hampshire, MD  ibuprofen (ADVIL,MOTRIN) 200 MG tablet Take 600 mg by mouth every 6 (six) hours as needed. pain   Yes Historical Provider, MD  Multiple Vitamin (MULTIVITAMIN WITH MINERALS) TABS Take 1 tablet by mouth daily.   Yes Historical Provider, MD  cephALEXin (KEFLEX) 500 MG capsule Take 1 capsule (500 mg total) by mouth 2 (two) times daily. Patient not taking: Reported  on 09/10/2016 08/23/16   Wallene Huh, DPM     No family history on file.  Social History   Social History  . Marital status: Divorced    Spouse name: N/A  . Number of children: N/A  . Years of education: N/A   Social History Main Topics  . Smoking status: Current Every Day Smoker    Packs/day: 1.00  . Smokeless tobacco: Never Used  . Alcohol use Yes  . Drug use: Yes    Types: Marijuana  . Sexual activity: Not on file   Other Topics Concern  . Not on file   Social History Narrative  . No  narrative on file      Review of Systems: A 12 point ROS discussed and pertinent positives are indicated in the HPI above.  All other systems are negative.  Review of Systems  Vital Signs: BP 121/70 (BP Location: Left Arm, Patient Position: Sitting, Cuff Size: Normal)   Pulse 60   Temp 98.2 F (36.8 C) (Oral)   Ht 5\' 8"  (1.727 m)   Wt 113 lb (51.3 kg)   BMI 17.18 kg/m   Physical Exam  General: 56 yo appearing older than stated age.  Well-developed, well-nourished.  No distress. HEENT: Atraumatic, normocephalic.  Conjugate gaze, extra-ocular motor intact. No scleral icterus or scleral injection. No lesions on external ears, nose, lips, or gums.  Oral mucosa moist, pink.  Neck: Symmetric with no goiter enlargement.  Chest/Lungs:  Symmetric chest with inspiration/expiration.  No labored breathing.  Clear to auscultation with no wheezes, rhonchi, or rales.  Heart:  RRR, with no third heart sounds appreciated. No JVD appreciated.  Abdomen:  Soft, NT/ND  Genito-urinary: Deferred Neurologic: Alert & Oriented to person, place, and time.   Normal affect and insight.  Appropriate questions.  Moving all 4 extremities with gross sensory intact.  Pulse Exam:  Palpable radial pulse bilateral.  Palpable right PT, right DP.  Non-palpable left PT, left DP.  Doppler positive DP/PT on the left and the right.  Extremities: Pallor of the distal fingers on bilateral hands.  No hand wound. Hairless bilateral upper and lower extremity.   No dependent rubor.  Purplish discoloration of all 5 toes of the left and right foot.  The worst color is at the second toe on the left and the right.  Dry wound at the second toe of both the left and right foot, with no evidence of infection.     Mallampati Score:  1  Imaging:  Non-invasive lower extremity arterial exam has been performed with the scanned report (CV Proc tab) viewed in EPIC.  Performed 08/05/2016.   Segmental doppler of bilateral LE shows tri-phasic  waveform to the ankle bilateral.  Right and left great toe waveform shows evidence of severe occlusive disease.  Toe PPG's are severely flattened bilaterally.  Right ABI: 1.31, Right TBI: 0.74 Left ABI: 1.32, Left TBI: 0.70  Labs:  CBC: No results for input(s): WBC, HGB, HCT, PLT in the last 8760 hours.  COAGS: No results for input(s): INR, APTT in the last 8760 hours.  BMP: No results for input(s): NA, K, CL, CO2, GLUCOSE, BUN, CALCIUM, CREATININE, GFRNONAA, GFRAA in the last 8760 hours.  Invalid input(s): CMP  LIVER FUNCTION TESTS: No results for input(s): BILITOT, AST, ALT, ALKPHOS, PROT, ALBUMIN in the last 8760 hours.  TUMOR MARKERS: No results for input(s): AFPTM, CEA, CA199, CHROMGRNA in the last 8760 hours.  Assessment and Plan:  [name] is a  56 year-old female presenting with severe, life-style limiting pain of her bilateral second toes and associated non-healing wounds.  The most likely etiology is a synergistic effect of her Raynaud's Disease and 30 pack-year smoking history.        Non-invasive lower extremity exam from 08/05/2016 shows no evidence of significant arterial disease to the ankles, with severely abnormal toe PPG and doppler waveforms.  These findings are compatible with her Raynaud's history.      I briefly discussed the anatomy and pathophysiology of Raynaud's, including the current understanding that smoking is a known risk to amplify the problem with auto-regulation in the digits leading to ischemia.  My impression is that her ischemic changes of the left and right second toe are mainly due to this synergistic effect, and most likely not related to more proximal disease.   I discussed with her my recommendation for confirmatory angiogram, given her pending tissue loss, so that if there were an obstructive arterial lesion more proximally, this could be addressed in order to salvage tissue.    I also emphasized that the management for Raynaud's is  typically medical support, with the most important factor risk factor modification.  In her case smoking cessation is most important.  She is currently attempting to quit.      Additional recommendations are for daily foot inspection, good nail care, avoiding barefoot walking, properly fitted footwear, seeking care with problems, in an effort to avoid foot trauma/infection.    At this time she has a concern for additional medical tests and the ability to pay/insurance coverage.  Given that the angiogram would be most likely a confirmatory test to rule out a more proximal lesion, I think it is reasonable to defer for now.   She has an appointment with her Cardiologist today, Dr. Fletcher Anon, to discuss her calcium channel blocker treatment.   Regarding medical management, maximal medical therapy for reduction of risk factors is indicated as recommended by updated AHA guidelines1.  For her this includes anti-platelet medication, and smoking cessation.  I think she would also benefit from HMG-CoA reductase inhibitor.  Plan: - Observe all forthcoming appointments, including today's appointment with Dr. Fletcher Anon.  - 3 month follow up appointment with Dr. Earleen Newport at [facility]. - Defer lower extremity angiogram for now, as she has concerns for additional medical bills.  If any rapid worsening occurs, would prioritize angiogram to rule out proximal lesion.   - Continue efforts with smoking cessation - Continue excellent foot care - Maximal medical therapy, including continuing the anti-platelets, with consideration of HMG-CoA reductase inhibitor given her CV risk factors.    PAD guidelines: 1- Gerhard-Herman MD, et al. 2016 AHA/ACC Guideline on the Management of Patients With Lower Extremity Peripheral Artery Disease: Executive Summary: A Report of the American College of Cardiology/American Heart Association Task Force on Clinical Practice Guidelines. J Am Coll Cardiol. 2017 Mar 21;69(11):1465-1508. doi:  10.1016/j.jacc.2016.11.008.   2 - Norgren L, et al. TASC II Working Group. Inter-society consensus for the management of peripheral arterial disease. Int Tressia Miners. 2007 Jun;26(2):81-157. Review. PubMed PMID: [id number]  3 - Hingorani A, et al. The management of diabetic foot: A clinical practice guideline by the Society for Vascular Surgery in collaboration with the [place] and the Society  for Vascular Medicine. J Vasc Surg. 2016 Feb;63(2 Suppl):3S-21S. doi: 10.1016/j.jvs.2015.10.003. PubMed PMID: [id number].  Thank you for this interesting consult.  I greatly enjoyed meeting [name] and look forward to participating in their care.  A copy of this report was sent to the requesting provider on this date.  Electronically Signed: Corrie Mckusick 09/10/2016, 9:11 AM   I spent a total of  40 Minutes   in face to face in clinical consultation, greater than 50% of which was counseling/coordinating care for foot pain, critical limb ischemia, Raynaud's Disease, possible lower extremity angiogram.

## 2016-09-10 NOTE — Patient Instructions (Signed)
Medication Instructions:  Your physician has recommended you make the following change in your medication:  1. INCREASE Amlodipine to 5mg  take one tablet by mouth daily  Labwork: No new orders.   Testing/Procedures: No new orders.   Follow-Up: Your physician recommends that you schedule a follow-up appointment in: 1 MONTH with Dr Fletcher Anon    Any Other Special Instructions Will Be Listed Below (If Applicable).  Note given for patient to return to work 10/14/2016 with no restrictions     If you need a refill on your cardiac medications before your next appointment, please call your pharmacy.

## 2016-09-24 ENCOUNTER — Other Ambulatory Visit: Payer: Self-pay | Admitting: Cardiovascular Disease

## 2016-09-24 DIAGNOSIS — I7300 Raynaud's syndrome without gangrene: Secondary | ICD-10-CM

## 2016-09-24 MED ORDER — AMLODIPINE BESYLATE 5 MG PO TABS: 5.0000 mg | ORAL_TABLET | Freq: Every day | ORAL | 1 refills | Status: DC

## 2016-10-01 ENCOUNTER — Ambulatory Visit (INDEPENDENT_AMBULATORY_CARE_PROVIDER_SITE_OTHER): Payer: Managed Care, Other (non HMO) | Admitting: Cardiovascular Disease

## 2016-10-01 ENCOUNTER — Encounter: Payer: Self-pay | Admitting: Cardiovascular Disease

## 2016-10-01 VITALS — BP 104/76 | HR 62 | Ht 68.0 in | Wt 123.0 lb

## 2016-10-01 DIAGNOSIS — I7300 Raynaud's syndrome without gangrene: Secondary | ICD-10-CM

## 2016-10-01 NOTE — Progress Notes (Signed)
Cardiology Office Note   Date:  10/01/2016   ID:  [name], DOB [birth year], MRN [id number]  PCP:  Shirline Frees, MD  Cardiologist:  Dr. Fletcher Anon  Chief Complaint  Patient presents with  . Follow-up    raynaud's disease      History of Present Illness: [name] is a 56 y.o. female who is here today for a follow-up visit regarding Raynaud's disease. She has no prior cardiac history and has no history of diabetes, hypertension or hyperlipidemia. She has prolonged history of tobacco use. She quit smoking a few weeks ago. She had Raynaud's disease affecting both hands and feet for at least 10 years. Her symptoms typically worsened significantly in the wintertime. The patient was seen for dark discoloration of second left toe after an injury. She underwent noninvasive vascular evaluation in our office which showed normal ABI and triphasic waveforms with normal toe pressure. However, bilateral lower digit PPG's were abnormal. It was felt that Raynaud's disease was contributing to this. I added small dose aspirin 81 mg once daily and amlodipine 2.5 mg once daily.  During last visit, I increased the dose of amlodipine to 5 mg once daily. She quit smoking. She noticed significant improvement in discoloration with improvement of symptoms and pain.   Past Medical History:  Diagnosis Date  . Raynaud disease   . Tobacco use     Past Surgical History:  Procedure Laterality Date  . CESAREAN SECTION       Current Outpatient Prescriptions  Medication Sig Dispense Refill  . acetaminophen (TYLENOL) 500 MG tablet Take 1,000 mg by mouth every 6 (six) hours as needed. pain    . amLODipine (NORVASC) 5 MG tablet Take 1 tablet (5 mg total) by mouth daily. 90 tablet 1  . aspirin EC 81 MG tablet Take 1 tablet (81 mg total) by mouth daily.    Marland Kitchen ibuprofen (ADVIL,MOTRIN) 200 MG tablet Take 600 mg by mouth every 6 (six) hours as needed. pain    . Multiple Vitamin (MULTIVITAMIN WITH  MINERALS) TABS Take 1 tablet by mouth daily.    Marland Kitchen PARoxetine (PAXIL) 20 MG tablet Take 20 mg by mouth daily.    . predniSONE (DELTASONE) 10 MG tablet Take 10 mg by mouth daily. For 3 weeks     No current facility-administered medications for this visit.     Allergies:   Patient has no known allergies.    Social History:  The patient  reports that she has been smoking.  She has been smoking about 1.00 pack per day. She has never used smokeless tobacco. She reports that she drinks alcohol. She reports that she uses drugs, including Marijuana.   Family History:  The patient's Family history is negative for coronary artery disease or sudden death.  ROS:  Please see the history of present illness.   Otherwise, review of systems are positive for none.   All other systems are reviewed and negative.    PHYSICAL EXAM: VS:  BP 104/76   Pulse 62   Ht 5\' 8"  (1.727 m)   Wt 123 lb (55.8 kg)   BMI 18.70 kg/m  , BMI Body mass index is 18.7 kg/m. GEN: Well nourished, well developed, in no acute distress  HEENT: normal  Neck: no JVD, carotid bruits, or masses Cardiac: RRR; no murmurs, rubs, or gallops,no edema  Respiratory:  clear to auscultation bilaterally, normal work of breathing GI: soft, nontender, nondistended, + BS MS: no deformity or atrophy  Skin: warm and dry, no rash Neuro:  Strength and sensation are intact Psych: euthymic mood, full affect Vascular: Radial pulses normal bilaterally. Femoral pulses normal bilaterally. Posterior tibial and dorsalis pedis is normal bilaterally. Dark discoloration of the second left and right toes has improved significantly.   EKG:  EKG is not ordered today.   Recent Labs: No results found for requested labs within last 8760 hours.    Lipid Panel No results found for: CHOL, TRIG, HDL, CHOLHDL, VLDL, LDLCALC, LDLDIRECT    Wt Readings from Last 3 Encounters:  10/01/16 123 lb (55.8 kg)  09/10/16 113 lb (51.3 kg)  09/10/16 117 lb 3.2 oz (53.2  kg)       No flowsheet data found.    ASSESSMENT AND PLAN:  1.  Raynaud's disease:  Symptoms improved significantly with avoidance of all other, smoking cessation and amlodipine. She was also placed on prednisone with plans to add Plaquenil by Dr. Dossie Der and rheumatology. I have advised her that she can resume work on April 9 without restriction.  2. Tobacco use: I congratulated her on smoking cessation.  Disposition:   FU with me in 4 month  Signed,  Latasha Sacramento, MD  10/01/2016 10:59 AM    [place]

## 2016-10-01 NOTE — Patient Instructions (Signed)
Medication Instructions:  Your physician recommends that you continue on your current medications as directed. Please refer to the Current Medication list given to you today.   Follow-Up: Your physician wants you to follow-up in: 4 MONTHS with Dr. Fletcher Anon. You will receive a reminder letter in the mail two months in advance. If you don't receive a letter, please call our office to schedule the follow-up appointment.   Any Other Special Instructions Will Be Listed Below (If Applicable).     If you need a refill on your cardiac medications before your next appointment, please call your pharmacy.

## 2016-10-07 ENCOUNTER — Telehealth: Payer: Self-pay | Admitting: Cardiovascular Disease

## 2016-10-07 NOTE — Telephone Encounter (Signed)
I spoke with the pt and made her aware that I do not have the form. Dr Fletcher Anon is out of the office on vacation this week. I will reach out to [facility] in the [place] office to see if Dr Fletcher Anon gave her this form. The pt also plans to bring another copy to the office to see if another provider will sign her form.

## 2016-10-07 NOTE — Telephone Encounter (Signed)
New message      Calling to get update on return to work form that was giving to Dr Fletcher Anon on 10-01-16.  She needs form returned to [employer] asap.  If we do not have form, can pt bring another form and have Dr Fletcher Anon sign it while she waits?  Please call

## 2016-10-08 NOTE — Telephone Encounter (Signed)
Called patient and let her know form is here in [place].  Dr Fletcher Anon did fill it out and sign it. She asked me to fax it to [facility] at [phone].  She would also like it mailed to her. Document faxed and mailed to patient.

## 2016-12-04 ENCOUNTER — Other Ambulatory Visit: Payer: Self-pay | Admitting: Interventional Radiology

## 2016-12-04 DIAGNOSIS — I7300 Raynaud's syndrome without gangrene: Secondary | ICD-10-CM

## 2016-12-23 ENCOUNTER — Encounter: Payer: Self-pay | Admitting: Interventional Radiology

## 2017-04-13 ENCOUNTER — Other Ambulatory Visit: Payer: Self-pay | Admitting: Cardiovascular Disease

## 2017-04-13 DIAGNOSIS — I7300 Raynaud's syndrome without gangrene: Secondary | ICD-10-CM

## 2017-04-14 NOTE — Telephone Encounter (Signed)
,  Rx(s) sent to pharmacy electronically.  

## 2017-04-14 NOTE — Telephone Encounter (Signed)
Please review for refill. Thanks!  

## 2017-08-12 ENCOUNTER — Other Ambulatory Visit: Payer: Self-pay | Admitting: Cardiovascular Disease

## 2017-08-12 DIAGNOSIS — I7300 Raynaud's syndrome without gangrene: Secondary | ICD-10-CM

## 2017-08-12 NOTE — Telephone Encounter (Signed)
Refill Request.  

## 2017-10-07 ENCOUNTER — Ambulatory Visit (INDEPENDENT_AMBULATORY_CARE_PROVIDER_SITE_OTHER): Payer: Managed Care, Other (non HMO) | Admitting: Cardiovascular Disease

## 2017-10-07 ENCOUNTER — Encounter: Payer: Self-pay | Admitting: Cardiovascular Disease

## 2017-10-07 VITALS — BP 110/66 | HR 54 | Ht 68.0 in | Wt 124.8 lb

## 2017-10-07 DIAGNOSIS — Z720 Tobacco use: Secondary | ICD-10-CM | POA: Diagnosis not present

## 2017-10-07 DIAGNOSIS — I7300 Raynaud's syndrome without gangrene: Secondary | ICD-10-CM | POA: Diagnosis not present

## 2017-10-07 NOTE — Progress Notes (Signed)
Cardiology Office Note   Date:  10/08/2017   ID:  [name], DOB 1960-10-15, MRN [id number]  PCP:  Shirline Frees, MD  Cardiologist:  Dr. Fletcher Anon  No chief complaint on file.     History of Present Illness: [name] is a 57 y.o. female who is here today for a follow-up visit regarding Raynaud's disease. She has no prior cardiac history and has no history of diabetes, hypertension or hyperlipidemia. She has prolonged history of tobacco use.  Her symptoms have been stable with amlodipine and low-dose aspirin.  She reports that this winter was actually better than last year.  She had dark discoloration of the second toe bilaterally last year that improved with treatment. Noninvasive vascular studies showed normal ABI and triphasic waveforms with normal toe pressure.  No chest pain or shortness of breath.  Unfortunately, she was able to quit smoking for only 1 month and then relapsed.  Past Medical History:  Diagnosis Date  . Raynaud disease   . Tobacco use     Past Surgical History:  Procedure Laterality Date  . CESAREAN SECTION    . IR RADIOLOGIST EVAL & MGMT  09/10/2016     Current Outpatient Medications  Medication Sig Dispense Refill  . amLODipine (NORVASC) 5 MG tablet Take 1 tablet (5 mg total) by mouth daily. Please make yearly appt with Dr. Fletcher Anon for March before anymore refills. 1st attempt 90 tablet 0  . aspirin EC 81 MG tablet Take 1 tablet (81 mg total) by mouth daily.    . Multiple Vitamin (MULTIVITAMIN WITH MINERALS) TABS Take 1 tablet by mouth daily.    Marland Kitchen acetaminophen (TYLENOL) 500 MG tablet Take 1,000 mg by mouth every 6 (six) hours as needed. pain    . ibuprofen (ADVIL,MOTRIN) 200 MG tablet Take 600 mg by mouth every 6 (six) hours as needed. pain    . PARoxetine (PAXIL) 20 MG tablet Take 20 mg by mouth daily.    . predniSONE (DELTASONE) 10 MG tablet Take 10 mg by mouth daily. For 3 weeks     No current facility-administered medications for this  visit.     Allergies:   Patient has no known allergies.    Social History:  The patient  reports that she has been smoking.  She has been smoking about 1.00 pack per day. She has never used smokeless tobacco. She reports that she drinks alcohol. She reports that she has current or past drug history. Drug: Marijuana.   Family History:  The patient's Family history is negative for coronary artery disease or sudden death.  ROS:  Please see the history of present illness.   Otherwise, review of systems are positive for none.   All other systems are reviewed and negative.    PHYSICAL EXAM: VS:  BP 110/66   Pulse (!) 54   Ht 5\' 8"  (1.727 m)   Wt 124 lb 12.8 oz (56.6 kg)   BMI 18.98 kg/m  , BMI Body mass index is 18.98 kg/m. GEN: Well nourished, well developed, in no acute distress  HEENT: normal  Neck: no JVD, carotid bruits, or masses Cardiac: RRR; no murmurs, rubs, or gallops,no edema  Respiratory:  clear to auscultation bilaterally, normal work of breathing GI: soft, nontender, nondistended, + BS MS: no deformity or atrophy  Skin: warm and dry, no rash Neuro:  Strength and sensation are intact Psych: euthymic mood, full affect Vascular: Radial pulses normal bilaterally. Femoral pulses normal bilaterally. Posterior tibial  and dorsalis pedis is normal bilaterally.    EKG:  EKG is  ordered today. EKG showed sinus bradycardia with possible left atrial enlargement and poor R wave progression anterior leads.  Recent Labs: No results found for requested labs within last 8760 hours.    Lipid Panel No results found for: CHOL, TRIG, HDL, CHOLHDL, VLDL, LDLCALC, LDLDIRECT    Wt Readings from Last 3 Encounters:  10/07/17 124 lb 12.8 oz (56.6 kg)  10/01/16 123 lb (55.8 kg)  09/10/16 113 lb (51.3 kg)       No flowsheet data found.    ASSESSMENT AND PLAN:  1.  Raynaud's disease: Symptoms are well controlled with amlodipine.    2. Tobacco use: I again discussed with her the  importance of smoking cessation.  Disposition:   FU with me in 12 month  Signed,  Kathlyn Sacramento, MD  10/08/2017 9:05 AM    [place]

## 2017-10-07 NOTE — Patient Instructions (Signed)

## 2017-11-04 ENCOUNTER — Other Ambulatory Visit: Payer: Self-pay | Admitting: Cardiovascular Disease

## 2017-11-04 DIAGNOSIS — I7300 Raynaud's syndrome without gangrene: Secondary | ICD-10-CM

## 2017-11-04 NOTE — Telephone Encounter (Signed)
Please review for refill. Thanks!  

## 2018-06-14 ENCOUNTER — Emergency Department (HOSPITAL_COMMUNITY): Payer: Managed Care, Other (non HMO)

## 2018-06-14 ENCOUNTER — Emergency Department (HOSPITAL_COMMUNITY)
Admission: EM | Admit: 2018-06-14 | Discharge: 2018-06-14 | Disposition: A | Discharge status: Home / Self Care | Payer: Managed Care, Other (non HMO) | Attending: Emergency Medicine | Admitting: Emergency Medicine

## 2018-06-14 ENCOUNTER — Encounter (HOSPITAL_COMMUNITY): Payer: Self-pay | Admitting: Emergency Medicine

## 2018-06-14 ENCOUNTER — Other Ambulatory Visit: Payer: Self-pay

## 2018-06-14 DIAGNOSIS — J069 Acute upper respiratory infection, unspecified: Secondary | ICD-10-CM | POA: Diagnosis not present

## 2018-06-14 DIAGNOSIS — R05 Cough: Secondary | ICD-10-CM | POA: Diagnosis present

## 2018-06-14 DIAGNOSIS — R001 Bradycardia, unspecified: Secondary | ICD-10-CM

## 2018-06-14 MED ORDER — BENZONATATE 100 MG PO CAPS: 100.0000 mg | ORAL_CAPSULE | Freq: Three times a day (TID) | ORAL | 0 refills | Status: DC

## 2018-06-14 MED ORDER — PREDNISONE 10 MG (21) PO TBPK: ORAL_TABLET | ORAL | 0 refills | Status: DC

## 2018-06-14 MED ORDER — ALBUTEROL SULFATE HFA 108 (90 BASE) MCG/ACT IN AERS: 2.0000 | INHALATION_SPRAY | RESPIRATORY_TRACT | 0 refills | Status: DC | PRN

## 2018-06-14 MED ORDER — AZITHROMYCIN 250 MG PO TABS: 250.0000 mg | ORAL_TABLET | Freq: Every day | ORAL | 0 refills | Status: DC

## 2018-06-14 NOTE — ED Notes (Signed)
Pt discharged from ED; instructions provided and scripts given; Pt encouraged to return to ED if symptoms worsen and to f/u with PCP; Pt verbalized understanding of all instructions 

## 2018-06-14 NOTE — Discharge Instructions (Signed)
Your symptoms may be due to a viral illness or environmental, however, due to the change in the symptoms, we will try a course of antibiotics.  Hand washing: Wash your hands throughout the day, but especially before and after touching the face, using the restroom, sneezing, coughing, or touching surfaces that have been coughed or sneezed upon. Hydration: Symptoms will be intensified and complicated by dehydration. Dehydration can also extend the duration of symptoms. Drink plenty of fluids and get plenty of rest. You should be drinking at least half a liter of water an hour to stay hydrated. Electrolyte drinks (ex. Gatorade, Powerade, Pedialyte) are also encouraged. You should be drinking enough fluids to make your urine light yellow, almost clear. If this is not the case, you are not drinking enough water. Please note that some of the treatments indicated below will not be effective if you are not adequately hydrated. Pain or fever: Ibuprofen, Naproxen, or acetaminophen (generic for Tylenol) for pain or fever.  Cough: Use the benzonatate (generic for Tessalon) for cough.  Albuterol: May use the albuterol as needed for instances of shortness of breath. Prednisone: Take the prednisone, as directed, in its entirety. Zyrtec or Claritin: May add these medication daily to control underlying symptoms of congestion, sneezing, and other signs of allergies.  These medications are available over-the-counter. Generics: Cetirizine (generic for Zyrtec) and loratadine (generic for Claritin). Fluticasone: Use fluticasone (generic for Flonase), as directed, for nasal and sinus congestion.  This medication is available over-the-counter. Congestion: Plain guaifenesin (generic for plain Mucinex) may help relieve congestion. Saline sinus rinses and saline nasal sprays may also help relieve congestion. If you do not have high blood pressure, heart problems, or an allergy to such medications, you may also try phenylephrine or  Sudafed. Sore throat: Warm liquids or Chloraseptic spray may help soothe a sore throat. Gargle twice a day with a salt water solution made from a half teaspoon of salt in a cup of warm water.  Follow up: Follow up with a primary care provider within the next two weeks should symptoms fail to resolve. Return: Return to the ED for significantly worsening symptoms, shortness of breath, persistent vomiting, large amounts of blood in stool, or any other major concerns.  For prescription assistance, may try using prescription discount sites or apps, such as goodrx.com  Your heart rate today was lower than normal.  Please follow-up with your primary care provider on this matter.

## 2018-06-14 NOTE — ED Triage Notes (Addendum)
To ED with c/o short of breath/cough productive for whitish colored sputum/ bloody nose that started 3 days ago. Alert/oriented x 4, w/d. Pink.

## 2018-06-14 NOTE — ED Provider Notes (Signed)
[facility] EMERGENCY DEPARTMENT Provider Note   CSN: [id number] Arrival date & time: 06/14/18  1048     History   Chief Complaint Chief Complaint  Patient presents with  . URI    HPI [name] is a 57 y.o. female.  HPI   [name] is a 57 y.o. female, with a history of Raynaud and tobacco abuse, presenting to the ED with productive cough and nasal congestion for about the last 3 weeks.  States she thinks the cough may have gotten worse a week ago.  There is been no change in sputum color or consistency, consistently clear or white.  Occasional shortness of breath with coughing.  States she has a cough at baseline due to her smoking.  She tried a dose of Mucinex this morning, but has not tried any other therapies. Denies fever/chills, chest pain, N/V/D, dizziness, syncope, sinus pain or pressure, lower extremity pain/edema, or any other complaints.    Past Medical History:  Diagnosis Date  . Raynaud disease   . Tobacco use     There are no active problems to display for this patient.   Past Surgical History:  Procedure Laterality Date  . CESAREAN SECTION    . IR RADIOLOGIST EVAL & MGMT  09/10/2016     OB History   None      Home Medications    Prior to Admission medications   Medication Sig Start Date End Date Taking? Authorizing Provider  acetaminophen (TYLENOL) 500 MG tablet Take 1,000 mg by mouth every 6 (six) hours as needed. pain    [provider]  albuterol (PROVENTIL HFA;VENTOLIN HFA) 108 (90 Base) MCG/ACT inhaler Inhale 2 puffs into the lungs every 4 (four) hours as needed for wheezing or shortness of breath. 06/14/18   Cortina Vultaggio C, PA-C  amLODipine (NORVASC) 5 MG tablet TAKE 1 TAB BY MOUTH DAILY. PLEASE MAKE YEARLY APPT W/ DR. Fletcher Anon FOR MARCH BEFORE REFILLS 1ST ATTEMPT 11/04/17   Wellington Hampshire, MD  aspirin EC 81 MG tablet Take 1 tablet (81 mg total) by mouth daily. 08/06/16   Wellington Hampshire, MD  azithromycin  (ZITHROMAX) 250 MG tablet Take 1 tablet (250 mg total) by mouth daily. Take first 2 tablets together, then 1 every day until finished. 06/14/18   Zafirah Vanzee C, PA-C  benzonatate (TESSALON) 100 MG capsule Take 1 capsule (100 mg total) by mouth every 8 (eight) hours. 06/14/18   Eriyana Sweeten C, PA-C  ibuprofen (ADVIL,MOTRIN) 200 MG tablet Take 600 mg by mouth every 6 (six) hours as needed. pain    [provider]  Multiple Vitamin (MULTIVITAMIN WITH MINERALS) TABS Take 1 tablet by mouth daily.    [provider]  PARoxetine (PAXIL) 20 MG tablet Take 20 mg by mouth daily. 08/23/16   [provider]  predniSONE (STERAPRED UNI-PAK 21 TAB) 10 MG (21) TBPK tablet Take 6 tabs (60mg ) day 1, 5 tabs (50mg ) day 2, 4 tabs (40mg ) day 3, 3 tabs (30mg ) day 4, 2 tabs (20mg ) day 5, and 1 tab (10mg ) day 6. 06/14/18   Tilden Broz, Helane Gunther, PA-C    Family History No family history on file.  Social History Social History   Tobacco Use  . Smoking status: Current Every Day Smoker    Packs/day: 1.00  . Smokeless tobacco: Never Used  Substance Use Topics  . Alcohol use: Yes  . Drug use: Yes    Types: Marijuana  Allergies   Patient has no known allergies.   Review of Systems Review of Systems  Constitutional: Negative for chills, diaphoresis and fever.  HENT: Positive for congestion. Negative for sinus pressure, sinus pain, sore throat, trouble swallowing and voice change.   Respiratory: Positive for cough and shortness of breath (Occasionally with coughing).   Cardiovascular: Negative for chest pain and leg swelling.  Gastrointestinal: Negative for abdominal pain, diarrhea, nausea and vomiting.  Neurological: Negative for dizziness, syncope, light-headedness and headaches.  All other systems reviewed and are negative.    Physical Exam Updated Vital Signs BP 118/88 (BP Location: Right Arm)   Pulse (!) 48   Temp 98.2 F (36.8 C) (Oral)   Resp 16   Physical Exam  Constitutional:  She appears well-developed and well-nourished. No distress.  HENT:  Head: Normocephalic and atraumatic.  Nose: Rhinorrhea present. Right sinus exhibits no maxillary sinus tenderness and no frontal sinus tenderness. Left sinus exhibits no maxillary sinus tenderness and no frontal sinus tenderness.  Eyes: Conjunctivae are normal.  Neck: Neck supple.  Cardiovascular: Normal rate, regular rhythm, normal heart sounds and intact distal pulses.  Pulmonary/Chest: Effort normal and breath sounds normal. No respiratory distress.  Abdominal: Soft. There is no tenderness. There is no guarding.  Musculoskeletal: She exhibits no edema.  Lymphadenopathy:    She has no cervical adenopathy.  Neurological: She is alert.  Skin: Skin is warm and dry. She is not diaphoretic.  Psychiatric: She has a normal mood and affect. Her behavior is normal.  Nursing note and vitals reviewed.    ED Treatments / Results  Labs (all labs ordered are listed, but only abnormal results are displayed) Labs Reviewed - No data to display  EKG EKG Interpretation  Date/Time:  Sunday June 14 2018 11:41:23 EST Ventricular Rate:  49 PR Interval:    QRS Duration: 93 QT Interval:  477 QTC Calculation: 431 R Axis:   98 Text Interpretation:  Sinus bradycardia Ventricular premature complex Nonspecific T wave abnormality Confirmed by Lajean Saver [phone]) on 06/14/2018 11:50:05 AM   Radiology Dg Chest 2 View  Result Date: 06/14/2018 CLINICAL DATA:  Productive cough, shortness of breath EXAM: CHEST - 2 VIEW COMPARISON:  None. FINDINGS: Lungs are clear.  Trace right pleural effusion.  No pneumothorax. Heart is normal in size. Visualized osseous structures are within normal limits. IMPRESSION: Trace right pleural effusion. Electronically Signed   By: Julian Hy M.D.   On: 06/14/2018 13:47    Procedures Procedures (including critical care time)  Medications Ordered in ED Medications - No data to display   Initial  Impression / Assessment and Plan / ED Course  I have reviewed the triage vital signs and the nursing notes.  Pertinent labs & imaging results that were available during my care of the patient were reviewed by me and considered in my medical decision making (see chart for details).     Patient presents with nasal congestion and cough. Patient is nontoxic appearing, afebrile, not tachycardic, not tachypneic, not hypotensive, maintains excellent SPO2 on room air, and is in no apparent distress.  Due to duration of symptoms and recent worsening in symptoms, we will try a course of antibiotics.  Patient noted to be bradycardic.  She has had pulse rates in the bradycardic range in the past noted with chart review.  She has not had any chest pain, shortness of breath, dizziness during her ED course. PCP follow up on this matter.  The patient was given instructions for  home care as well as return precautions. Patient voices understanding of these instructions, accepts the plan, and is comfortable with discharge.   Findings and plan of care discussed with Lajean Saver, MD.   Final Clinical Impressions(s) / ED Diagnoses   Final diagnoses:  Upper respiratory tract infection, unspecified type  Bradycardia    ED Discharge Orders         Ordered    azithromycin (ZITHROMAX) 250 MG tablet  Daily     06/14/18 1355    predniSONE (STERAPRED UNI-PAK 21 TAB) 10 MG (21) TBPK tablet     06/14/18 1355    albuterol (PROVENTIL HFA;VENTOLIN HFA) 108 (90 Base) MCG/ACT inhaler  Every 4 hours PRN     06/14/18 1355    benzonatate (TESSALON) 100 MG capsule  Every 8 hours     06/14/18 1355           Lorayne Bender, PA-C 06/14/18 1427    Lajean Saver, MD 06/16/18 [phone]

## 2018-11-25 ENCOUNTER — Other Ambulatory Visit: Payer: Self-pay | Admitting: Cardiovascular Disease

## 2018-11-25 DIAGNOSIS — I7300 Raynaud's syndrome without gangrene: Secondary | ICD-10-CM

## 2018-11-25 NOTE — Telephone Encounter (Signed)
Refill Request.  

## 2018-12-04 ENCOUNTER — Telehealth: Payer: Self-pay | Admitting: Cardiovascular Disease

## 2018-12-07 NOTE — Telephone Encounter (Signed)
Called x3 for pre reg °

## 2018-12-08 ENCOUNTER — Telehealth (INDEPENDENT_AMBULATORY_CARE_PROVIDER_SITE_OTHER): Payer: Managed Care, Other (non HMO) | Admitting: Cardiovascular Disease

## 2018-12-08 ENCOUNTER — Encounter: Payer: Self-pay | Admitting: Cardiovascular Disease

## 2018-12-08 DIAGNOSIS — I7300 Raynaud's syndrome without gangrene: Secondary | ICD-10-CM

## 2018-12-08 MED ORDER — AMLODIPINE BESYLATE 5 MG PO TABS: 5.0000 mg | ORAL_TABLET | Freq: Every day | ORAL | 3 refills | Status: DC

## 2018-12-08 NOTE — Patient Instructions (Signed)
Medication Instructions:  No change in medications If you need a refill on your cardiac medications before your next appointment, please call your pharmacy.   Lab work: None If you have labs (blood work) drawn today and your tests are completely normal, you will receive your results only by: Marland Kitchen MyChart Message (if you have MyChart) OR . A paper copy in the mail If you have any lab test that is abnormal or we need to change your treatment, we will call you to review the results.  Testing/Procedures: None  Follow-Up: Follow-up with Dr. Fletcher Anon in 1 year.

## 2018-12-08 NOTE — Progress Notes (Signed)
Virtual Visit via Video Note   This visit type was conducted due to national recommendations for restrictions regarding the COVID-19 Pandemic (e.g. social distancing) in an effort to limit this patient's exposure and mitigate transmission in our community.  Due to her co-morbid illnesses, this patient is at least at moderate risk for complications without adequate follow up.  This format is felt to be most appropriate for this patient at this time.  All issues noted in this document were discussed and addressed.  A limited physical exam was performed with this format.  Please refer to the patient's chart for her consent to telehealth for [facility].   Date:  12/08/2018   ID:  [name], DOB 1960-11-11, MRN [id number]  Patient Location: Home Provider Location: Office  PCP:  Shirline Frees, MD  Cardiologist:  Kathlyn Sacramento, MD  Electrophysiologist:  None   Evaluation Performed:  Follow-Up Visit  Chief Complaint: Doing well  History of Present Illness:    [name] is a 58 y.o. female was seen via video visit for follow-up regarding Raynaud's disease. She has no prior cardiac history and has no history of diabetes, hypertension or hyperlipidemia. She has prolonged history of tobacco use.  Her symptoms have been stable with amlodipine and low-dose aspirin.    Previous discoloration of the toes improved with treatment.   Noninvasive vascular studies in the past showed normal ABI and triphasic waveforms with normal toe pressure.    He has been doing well with no recent chest pain or shortness of breath.  No claudication.  She continues to smoke.  She was started on atorvastatin by her primary care physician for hyperlipidemia and she reports no side effects.  She had an upper respiratory tract infection recently and she tested negative for COVID.    The patient does not have symptoms concerning for COVID-19 infection (fever, chills, cough, or new shortness of breath).     Past Medical History:  Diagnosis Date  . Raynaud disease   . Tobacco use    Past Surgical History:  Procedure Laterality Date  . CESAREAN SECTION    . IR RADIOLOGIST EVAL & MGMT  09/10/2016     Current Meds  Medication Sig  . albuterol (PROVENTIL HFA;VENTOLIN HFA) 108 (90 Base) MCG/ACT inhaler Inhale 2 puffs into the lungs every 4 (four) hours as needed for wheezing or shortness of breath.  Marland Kitchen amLODipine (NORVASC) 5 MG tablet Take 1 tablet (5 mg total) by mouth daily. OV NEEDED 2ND ATTEMPT  . aspirin EC 81 MG tablet Take 1 tablet (81 mg total) by mouth daily.  Marland Kitchen atorvastatin (LIPITOR) 10 MG tablet Take 1 tablet by mouth daily.  . DULoxetine (CYMBALTA) 30 MG capsule Take 2 capsules by mouth daily.  Marland Kitchen ibuprofen (ADVIL,MOTRIN) 200 MG tablet Take 600 mg by mouth every 6 (six) hours as needed. pain  . LORazepam (ATIVAN) 0.5 MG tablet 1 2 TABLETS TWICE A DAY AS NEEDED FOR ANXIETY ORALLY 30 DAYS  . Multiple Vitamin (MULTIVITAMIN WITH MINERALS) TABS Take 1 tablet by mouth daily.  . [DISCONTINUED] acetaminophen (TYLENOL) 500 MG tablet Take 1,000 mg by mouth every 6 (six) hours as needed. pain  . [DISCONTINUED] PARoxetine (PAXIL) 20 MG tablet Take 20 mg by mouth daily.     Allergies:   Patient has no known allergies.   Social History   Tobacco Use  . Smoking status: Current Every Day Smoker    Packs/day: 1.00  . Smokeless tobacco: Never Used  Substance Use Topics  . Alcohol use: Yes  . Drug use: Yes    Types: Marijuana     Family Hx: The patient's family history is not on file.  ROS:   Please see the history of present illness.     All other systems reviewed and are negative.   Prior CV studies:   The following studies were reviewed today:    Labs/Other Tests and Data Reviewed:    EKG:  No ECG reviewed.  Recent Labs: No results found for requested labs within last 8760 hours.   Recent Lipid Panel No results found for: CHOL, TRIG, HDL, CHOLHDL, LDLCALC, LDLDIRECT   Wt Readings from Last 3 Encounters:  12/08/18 122 lb (55.3 kg)  10/07/17 124 lb 12.8 oz (56.6 kg)  10/01/16 123 lb (55.8 kg)     Objective:    Vital Signs:  Ht 5\' 8"  (1.727 m)   Wt 122 lb (55.3 kg)   BMI 18.55 kg/m    VITAL SIGNS:  reviewed GEN:  no acute distress EYES:  sclerae anicteric, EOMI - Extraocular Movements Intact RESPIRATORY:  normal respiratory effort, symmetric expansion SKIN:  no rash, lesions or ulcers. MUSCULOSKELETAL:  no obvious deformities. NEURO:  alert and oriented x 3, no obvious focal deficit PSYCH:  normal affect  ASSESSMENT & PLAN:    1.  Raynaud's disease: Symptoms are well controlled with amlodipine.    2. Tobacco use: I again discussed with her the importance of smoking cessation.  3.  Hyperlipidemia: Currently on atorvastatin.  COVID-19 Education: The signs and symptoms of COVID-19 were discussed with the patient and how to seek care for testing (follow up with PCP or arrange E-visit).  The importance of social distancing was discussed today.  Time:   Today, I have spent 10 minutes with the patient with telehealth technology discussing the above problems.     Medication Adjustments/Labs and Tests Ordered: Current medicines are reviewed at length with the patient today.  Concerns regarding medicines are outlined above.   Tests Ordered: No orders of the defined types were placed in this encounter.   Medication Changes: No orders of the defined types were placed in this encounter.   Disposition:  Follow up in 1 year(s)  Signed, Kathlyn Sacramento, MD  12/08/2018 9:32 AM    [facility]

## 2019-08-12 ENCOUNTER — Other Ambulatory Visit: Payer: Self-pay | Admitting: Obstetrics and Gynecology

## 2019-08-12 DIAGNOSIS — N6320 Unspecified lump in the left breast, unspecified quadrant: Secondary | ICD-10-CM

## 2019-08-17 ENCOUNTER — Other Ambulatory Visit: Payer: Self-pay | Admitting: Obstetrics and Gynecology

## 2019-08-17 DIAGNOSIS — Z720 Tobacco use: Secondary | ICD-10-CM

## 2019-08-24 ENCOUNTER — Ambulatory Visit
Admission: RE | Admit: 2019-08-24 | Discharge: 2019-08-24 | Disposition: A | Discharge status: Home / Self Care | Payer: Managed Care, Other (non HMO) | Source: Ambulatory Visit | Attending: Obstetrics and Gynecology | Admitting: Obstetrics and Gynecology

## 2019-08-24 ENCOUNTER — Other Ambulatory Visit: Payer: Self-pay

## 2019-08-24 DIAGNOSIS — N6320 Unspecified lump in the left breast, unspecified quadrant: Secondary | ICD-10-CM

## 2019-08-25 ENCOUNTER — Other Ambulatory Visit: Payer: Managed Care, Other (non HMO)

## 2019-08-31 ENCOUNTER — Ambulatory Visit
Admission: RE | Admit: 2019-08-31 | Discharge: 2019-08-31 | Disposition: A | Discharge status: Home / Self Care | Payer: Managed Care, Other (non HMO) | Source: Ambulatory Visit | Attending: Obstetrics and Gynecology | Admitting: Obstetrics and Gynecology

## 2019-08-31 DIAGNOSIS — Z720 Tobacco use: Secondary | ICD-10-CM

## 2019-10-25 ENCOUNTER — Other Ambulatory Visit: Payer: Self-pay | Admitting: Obstetrics and Gynecology

## 2019-10-25 DIAGNOSIS — Z720 Tobacco use: Secondary | ICD-10-CM

## 2019-11-05 DIAGNOSIS — I7300 Raynaud's syndrome without gangrene: Secondary | ICD-10-CM | POA: Insufficient documentation

## 2019-11-05 DIAGNOSIS — M359 Systemic involvement of connective tissue, unspecified: Secondary | ICD-10-CM | POA: Insufficient documentation

## 2019-11-05 DIAGNOSIS — F329 Major depressive disorder, single episode, unspecified: Secondary | ICD-10-CM | POA: Insufficient documentation

## 2019-11-05 DIAGNOSIS — F32A Depression, unspecified: Secondary | ICD-10-CM | POA: Insufficient documentation

## 2019-11-08 ENCOUNTER — Other Ambulatory Visit: Payer: Self-pay

## 2019-11-08 ENCOUNTER — Encounter: Payer: Self-pay | Admitting: Internal Medicine

## 2019-11-08 ENCOUNTER — Ambulatory Visit (INDEPENDENT_AMBULATORY_CARE_PROVIDER_SITE_OTHER): Payer: Managed Care, Other (non HMO) | Admitting: Internal Medicine

## 2019-11-08 VITALS — BP 108/64 | HR 66 | Temp 98.2°F | Ht 68.0 in | Wt 117.0 lb

## 2019-11-08 DIAGNOSIS — Z8679 Personal history of other diseases of the circulatory system: Secondary | ICD-10-CM | POA: Diagnosis not present

## 2019-11-08 DIAGNOSIS — J439 Emphysema, unspecified: Secondary | ICD-10-CM

## 2019-11-08 DIAGNOSIS — F17200 Nicotine dependence, unspecified, uncomplicated: Secondary | ICD-10-CM | POA: Diagnosis not present

## 2019-11-08 DIAGNOSIS — F1290 Cannabis use, unspecified, uncomplicated: Secondary | ICD-10-CM | POA: Diagnosis not present

## 2019-11-08 DIAGNOSIS — I2510 Atherosclerotic heart disease of native coronary artery without angina pectoris: Secondary | ICD-10-CM

## 2019-11-08 MED ORDER — VARENICLINE TARTRATE 0.5 MG X 11 & 1 MG X 42 PO MISC: ORAL | 0 refills | Status: DC

## 2019-11-08 MED ORDER — BEVESPI AEROSPHERE 9-4.8 MCG/ACT IN AERO: 2.0000 | INHALATION_SPRAY | Freq: Every day | RESPIRATORY_TRACT | 11 refills | Status: DC

## 2019-11-08 NOTE — Patient Instructions (Addendum)
Pulmonary emphysema, unspecified emphysema type ([place])  - I think tobacco, marijuan and prior smoking have caused lung damage  plan - start bevespi 2 puff twice daily   - if costly let us know   - maintenance inhaler neded  - do albuterol as needed - check alpha 1 AT phenotype - do full PFT first available   Smoking Tobacco Marijuana smoker  #SMoking  - glad you want to quit  -  start chantix as directed  - Days 1-3: 0.5 mg once daily  - Days 4-7: 0.5 mg twice daily  - Maintenance (? Day 8):  1 mg twice daily for 11 weeks   - if you have bad dream stop medication and call us  - take the medicaiton as instructed and after food. Drink water - quit smoking  1 week after starting chantix      History of Raynaud's syndrome  - sign release to get records from Dr Dossie Der  - management per rheumatology  Coronary artery calcification on CT scan of the chest  -Refer to cardiology  Followup   - video with with app next 2-4 weeks but after starting inhaler, chantix and completing PFT

## 2019-11-08 NOTE — Progress Notes (Signed)
OV 11/08/2019  Subjective:  Patient ID: [name], female , DOB: 1961-07-04 , age 59 y.o. , MRN: [id number] , ADDRESS: [place] [id number]   11/08/2019 -   Chief Complaint  Patient presents with  . Consult    Pt states she has been smoking x30 years and due to this, she does have occ SOB. Pt had a CT performed which she stated showed emphysema.     HPI [name] 59 y.o. -referred by primary care for COPD evaluation and diagnosis.  According to the patient she has been at baseline health.  She has a longstanding history of multiple decades of Raynaud's.  She has seen Dr. Dossie Der once but does not want to visit there again.  She has had blood work for that.  In the remote past she is to abuse cocaine but currently smokes marijuana fume times a month.  She is also continued heavy smoker.  She works in [employer].  She is 1 pack a day for 35 years tobacco smoker.  For the last year she has had insidious onset of shortness of breath and cough that is progressively getting worse.  Severity symptoms are listed below.  Prior to that she was fine although earlier chest x-ray there personally visualized shows hyperinflation consistent with COPD.  She had a CT scan of the chest for lung cancer screening that I personally visualized shows emphysema no lung cancer no nodules.  Therefore she has been referred here.  She was given Spiriva but she has not started this.  She says this because of expense although she cannot specify how expensive it is.  In the past year she has tried Chantix for quitting smoking but has not been able to quit smoking.  She says it did work.  She says it caused some constipation.  She is wearing for me to manage her quitting smoking because she wants to really quit smoking.  She says the Chantix can also help quit marijuana use.     CAT COPD Symptom & Quality of Life Score (GSK trademark) 0 is no burden. 5 is highest  burden 11/08/2019   Never Cough -> Cough all the time 5  No phlegm in chest -> Chest is full of phlegm 5  No chest tightness -> Chest feels very tight 1  No dyspnea for 1 flight stairs/hill -> Very dyspneic for 1 flight of stairs 4  No limitations for ADL at home -> Very limited with ADL at home 3  Confident leaving home -> Not at all confident leaving home 0  Sleep soundly -> Do not sleep soundly because of lung condition 3  Lots of Energy -> No energy at all 3  TOTAL Score (max 40)  24      CAT Score 11/08/2019  Total CAT Score 24    Simple office walk 185 feet x  3 laps goal with forehead probe 11/08/2019   O2 used ra  Number laps completed 3  Comments about pace moderte  Resting Pulse Ox/HR 100% and 66/min  Final Pulse Ox/HR 98% and 102/min  Desaturated </= 88% no  Desaturated <= 3% points no  Got Tachycardic >/= 90/min yes  Symptoms at end of test Mild dyspnea  Miscellaneous comments x      ROS - per HPI   CT chest 08/31/2019 Lungs/Pleura: Centrilobular and paraseptal emphysema evident. Biapical pleuroparenchymal scarring noted. Numerous bilateral scattered calcified and  noncalcified pulmonary nodules are identified. Dominant noncalcified pulmonary nodule is in the left costophrenic sulcus with volume derived equivalent diameter of 6.6 mm on image 320. No focal airspace consolidation. No pleural effusion.  Upper Abdomen: Unremarkable  Musculoskeletal: No worrisome lytic or sclerotic osseous abnormality.  IMPRESSION: 1. Lung-RADS category 3, probably benign findings. Short-term follow-up in 6 months is recommended with repeat low-dose chest CT without contrast (please use the following order, "CT CHEST LCS NODULE FOLLOW-UP /O CM"). 2.  Emphysema (ICD10-J43.9) and Aortic Atherosclerosis (ICD10-170.0) #3.coronary artery calcification present.  Electronically Signed   By: Misty Stanley M.D.   On: 08/31/2019 12:46    has a past medical history of  Raynaud disease and Tobacco use.   reports that she has been smoking cigarettes. She has a 60.00 pack-year smoking history. She has never used smokeless tobacco.  Past Surgical History:  Procedure Laterality Date  . CESAREAN SECTION    . IR RADIOLOGIST EVAL & MGMT  09/10/2016    No Known Allergies   There is no immunization history on file for this patient.  No family history on file.   Current Outpatient Medications:  .  albuterol (PROVENTIL HFA;VENTOLIN HFA) 108 (90 Base) MCG/ACT inhaler, Inhale 2 puffs into the lungs every 4 (four) hours as needed for wheezing or shortness of breath., Disp: 1 Inhaler, Rfl: 0 .  amLODipine (NORVASC) 5 MG tablet, Take 1 tablet (5 mg total) by mouth daily., Disp: 90 tablet, Rfl: 3 .  aspirin EC 81 MG tablet, Take 1 tablet (81 mg total) by mouth daily., Disp: , Rfl:  .  atorvastatin (LIPITOR) 10 MG tablet, Take 1 tablet by mouth daily., Disp: , Rfl:  .  ibuprofen (ADVIL,MOTRIN) 200 MG tablet, Take 600 mg by mouth every 6 (six) hours as needed. pain, Disp: , Rfl:  .  LORazepam (ATIVAN) 0.5 MG tablet, 1 2 TABLETS TWICE A DAY AS NEEDED FOR ANXIETY ORALLY 30 DAYS, Disp: , Rfl:  .  Multiple Vitamin (MULTIVITAMIN WITH MINERALS) TABS, Take 1 tablet by mouth daily., Disp: , Rfl:       Objective:   Vitals:   11/08/19 1023  BP: 108/64  Pulse: 66  Temp: 98.2 F (36.8 C)  TempSrc: Temporal  SpO2: 100%  Weight: 117 lb (53.1 kg)  Height: 5\' 8"  (1.727 m)    Estimated body mass index is 17.79 kg/m as calculated from the following:   Height as of this encounter: 5\' 8"  (1.727 m).   Weight as of this encounter: 117 lb (53.1 kg).  @WEIGHTCHANGE @  [employer]   11/08/19 1023  Weight: 117 lb (53.1 kg)     Physical Exam  General Appearance:    Alert, cooperative, no distress, appears stated age - yes , Deconditioned looking - no , OBESE  - no, Sitting on Wheelchair -  no  Head:    Normocephalic, without obvious abnormality, atraumatic  Eyes:     PERRL, conjunctiva/corneas clear,  Ears:    Normal TM's and external ear canals, both ears  Nose:   Nares normal, septum midline, mucosa normal, no drainage    or sinus tenderness. OXYGEN ON  - no . Patient is @ no   Throat:   Lips, mucosa, and tongue normal; teeth and gums normal. Cyanosis on lips - no  Neck:   Supple, symmetrical, trachea midline, no adenopathy;    thyroid:  no enlargement/tenderness/nodules; no carotid   bruit or JVD  Back:     Symmetric, no curvature, ROM  normal, no CVA tenderness  Lungs:     Distress - no , Wheeze no, Barrell Chest - yes, Purse lip breathing - yes, Crackles - o   Chest Wall:    No tenderness or deformity.    Heart:    Regular rate and rhythm, S1 and S2 normal, no rub   or gallop, Murmur - no  Breast Exam:    NOT DONE  Abdomen:     Soft, non-tender, bowel sounds active all four quadrants,    no masses, no organomegaly. 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 Pulmonary emphysema, unspecified emphysema type ([place])  J43.9   2. Smoking  F17.200   3. Marijuana smoker  F12.90   4. History of Raynaud's syndrome  Z86.79   5. Coronary artery calcification seen on CAT scan  I25.10        Plan:     Patient Instructions  Pulmonary emphysema, unspecified emphysema type ([place])  - I think tobacco, marijuan and prior smoking have caused lung damage  plan - start bevespi 2 puff twice daily   - if costly let us know   - maintenance  inhaler neded  - do albuterol as needed - check alpha 1 AT phenotype - do full PFT first available   Smoking Tobacco Marijuana smoker  #SMoking  - glad you want to quit  -  start chantix as directed  - Days 1-3: 0.5 mg once daily  - Days 4-7: 0.5 mg twice daily  - Maintenance (? Day 8):  1 mg twice daily for 11 weeks   - if you have bad dream stop medication and call us  - take the medicaiton as instructed and after food. Drink water - quit smoking  1 week after starting chantix      History of Raynaud's syndrome  - sign release to get records from Dr Dossie Der  - management per rheumatology  Coronary artery calcification on CT scan of the chest  -Refer to cardiology  Followup   - video with with app next 2-4 weeks but after starting inhaler, chantix and completing PFT       SIGNATURE    Dr. Brand Males, M.D., F.C.C.P,  Pulmonary and Critical Care Medicine Staff Physician, [place] Director - Interstitial Lung Disease  Program  Pulmonary [place] at [place], [place], [address]  Pager: [phone], If no answer or between  15:00h - 7:00h: call [phone] Telephone: [phone]  11:03 AM 11/08/2019

## 2019-11-08 NOTE — Addendum Note (Signed)
Addended by: Lorretta Harp on: 11/08/2019 11:11 AM   Modules accepted: Orders

## 2019-11-12 ENCOUNTER — Telehealth: Payer: Self-pay | Admitting: Internal Medicine

## 2019-11-12 NOTE — Telephone Encounter (Signed)
ATC patient unable to reach LM to call back office (x1)  

## 2019-11-16 NOTE — Telephone Encounter (Signed)
LMTCB x2 for pt 

## 2019-11-17 NOTE — Telephone Encounter (Signed)
ATC patient X3 LMTCB per protocol will close this encounter

## 2019-12-07 ENCOUNTER — Ambulatory Visit (INDEPENDENT_AMBULATORY_CARE_PROVIDER_SITE_OTHER): Payer: Managed Care, Other (non HMO) | Admitting: Cardiovascular Disease

## 2019-12-07 ENCOUNTER — Other Ambulatory Visit: Payer: Self-pay

## 2019-12-07 ENCOUNTER — Encounter: Payer: Self-pay | Admitting: Cardiovascular Disease

## 2019-12-07 VITALS — BP 108/62 | HR 75 | Temp 97.7°F | Ht 68.0 in | Wt 118.0 lb

## 2019-12-07 DIAGNOSIS — E785 Hyperlipidemia, unspecified: Secondary | ICD-10-CM | POA: Diagnosis not present

## 2019-12-07 DIAGNOSIS — I7300 Raynaud's syndrome without gangrene: Secondary | ICD-10-CM

## 2019-12-07 DIAGNOSIS — Z720 Tobacco use: Secondary | ICD-10-CM | POA: Diagnosis not present

## 2019-12-07 MED ORDER — ATORVASTATIN CALCIUM 20 MG PO TABS: 20.0000 mg | ORAL_TABLET | Freq: Every day | ORAL | 1 refills | Status: DC

## 2019-12-07 NOTE — Progress Notes (Signed)
Cardiology Office Note   Date:  12/07/2019   ID:  [name], DOB Sep 14, 1960, MRN [id number]  PCP:  Shirline Frees, MD  Cardiologist:  Dr. Fletcher Anon  No chief complaint on file.     History of Present Illness: [name] is a 59 y.o. female who is here today for a follow-up visit regarding Raynaud's disease. She has prolonged history of tobacco use.  Her symptoms have been stable with amlodipine and low-dose aspirin.    Previous discoloration of the toes improved with treatment.   Noninvasive vascular studies in the past showed normal ABI and triphasic waveforms with normal toe pressure.  Since last year, she has been diagnosed with COPD.  She had CT scan of the chest for cancer screening which showed evidence of aortic and coronary atherosclerosis and calcifications.  She denies chest pain.  She does have exertional dyspnea likely related to underlying COPD.  She continues to take amlodipine 5 mg daily for Raynaud's with stable symptoms.    Past Medical History:  Diagnosis Date  . Raynaud disease   . Tobacco use     Past Surgical History:  Procedure Laterality Date  . CESAREAN SECTION    . IR RADIOLOGIST EVAL & MGMT  09/10/2016     Current Outpatient Medications  Medication Sig Dispense Refill  . albuterol (PROVENTIL HFA;VENTOLIN HFA) 108 (90 Base) MCG/ACT inhaler Inhale 2 puffs into the lungs every 4 (four) hours as needed for wheezing or shortness of breath. 1 Inhaler 0  . amLODipine (NORVASC) 5 MG tablet Take 1 tablet (5 mg total) by mouth daily. 90 tablet 3  . aspirin EC 81 MG tablet Take 1 tablet (81 mg total) by mouth daily.    Marland Kitchen atorvastatin (LIPITOR) 10 MG tablet Take 1 tablet by mouth daily.    . Glycopyrrolate-Formoterol (BEVESPI AEROSPHERE) 9-4.8 MCG/ACT AERO Inhale 2 puffs into the lungs daily. 5.9 g 11  . ibuprofen (ADVIL,MOTRIN) 200 MG tablet Take 600 mg by mouth every 6 (six) hours as needed. pain    . LORazepam (ATIVAN) 0.5 MG tablet 1 2 TABLETS  TWICE A DAY AS NEEDED FOR ANXIETY ORALLY 30 DAYS    . Multiple Vitamin (MULTIVITAMIN WITH MINERALS) TABS Take 1 tablet by mouth daily.    . varenicline (CHANTIX PAK) 0.5 MG X 11 & 1 MG X 42 tablet Take one 0.5 mg tablet by mouth once daily for 3 days, then increase to one 0.5 mg tablet twice daily for 4 days, then increase to one 1 mg tablet twice daily. 53 tablet 0   No current facility-administered medications for this visit.    Allergies:   Patient has no known allergies.    Social History:  The patient  reports that she has been smoking cigarettes. She has a 60.00 pack-year smoking history. She has never used smokeless tobacco. She reports current alcohol use. She reports current drug use. Drug: Marijuana.   Family History:  The patient's Family history is negative for coronary artery disease or sudden death.  ROS:  Please see the history of present illness.   Otherwise, review of systems are positive for none.   All other systems are reviewed and negative.    PHYSICAL EXAM: VS:  BP 108/62   Pulse 75   Temp 97.7 F (36.5 C)   Ht 5\' 8"  (1.727 m)   Wt 118 lb (53.5 kg)   SpO2 98%   BMI 17.94 kg/m  , BMI Body mass index is  17.94 kg/m. GEN: Well nourished, well developed, in no acute distress  HEENT: normal  Neck: no JVD, carotid bruits, or masses Cardiac: RRR; no murmurs, rubs, or gallops,no edema  Respiratory:  clear to auscultation bilaterally with mildly diminished breath sounds, normal work of breathing GI: soft, nontender, nondistended, + BS MS: no deformity or atrophy  Skin: warm and dry, no rash Neuro:  Strength and sensation are intact Psych: euthymic mood, full affect Vascular: Radial pulses normal bilaterally. Femoral pulses normal bilaterally. Posterior tibial and dorsalis pedis is normal bilaterally.    EKG:  EKG is  ordered today. EKG showed normal sinus rhythm with left atrial enlargement and incomplete right bundle branch block.  Poor R wave progression in  the precordial leads is chronic.  Recent Labs: No results found for requested labs within last 8760 hours.    Lipid Panel No results found for: CHOL, TRIG, HDL, CHOLHDL, VLDL, LDLCALC, LDLDIRECT    Wt Readings from Last 3 Encounters:  12/07/19 118 lb (53.5 kg)  11/08/19 117 lb (53.1 kg)  12/08/18 122 lb (55.3 kg)       No flowsheet data found.    ASSESSMENT AND PLAN:  1.  Raynaud's disease: Symptoms are well controlled with amlodipine.    2. Tobacco use: She is more motivated now to quit smoking given recent diagnosis of COPD.  She got a Chantix prescription but has not started the medication yet.  I discussed with her the importance of complete cessation.  3.  Hyperlipidemia: Currently on atorvastatin 10 mg daily.  Most recent lipid profile in December showed an LDL of 105.  Given recent diagnosis of aortic and coronary calcifications on CT imaging, I think we have to be more aggressive with this and at least try to get her LDL below 100.  I increase atorvastatin to 20 mg daily.  4.  Coronary atherosclerosis: This was noted on CT imaging.  No convincing symptoms of angina.  Continue low-dose aspirin and management of hyperlipidemia.  Work on smoking cessation.   Disposition:   FU with me in 12 month  Signed,  Kathlyn Sacramento, MD  12/07/2019 11:40 AM    [place]

## 2019-12-07 NOTE — Patient Instructions (Signed)
Medication Instructions:  Increase Atorvastatin to 20 mg daily   *If you need a refill on your cardiac medications before your next appointment, please call your pharmacy*   Follow-Up: At [facility], you and your health needs are our priority.  As part of our continuing mission to provide you with exceptional heart care, we have created designated Provider Care Teams.  These Care Teams include your primary Cardiologist (physician) and Advanced Practice Providers (APPs -  Physician Assistants and Nurse Practitioners) who all work together to provide you with the care you need, when you need it.  We recommend signing up for the patient portal called "MyChart".  Sign up information is provided on this After Visit Summary.  MyChart is used to connect with patients for Virtual Visits (Telemedicine).  Patients are able to view lab/test results, encounter notes, upcoming appointments, etc.  Non-urgent messages can be sent to your provider as well.   To learn more about what you can do with MyChart, go to [email or web address].    Your next appointment:   12 month(s)  The format for your next appointment:   In Person  Provider:   Kathlyn Sacramento, MD

## 2019-12-21 ENCOUNTER — Other Ambulatory Visit (HOSPITAL_COMMUNITY): Payer: Managed Care, Other (non HMO)

## 2019-12-27 ENCOUNTER — Telehealth: Payer: Managed Care, Other (non HMO) | Admitting: Primary Care

## 2020-01-07 ENCOUNTER — Other Ambulatory Visit: Payer: Self-pay | Admitting: Internal Medicine

## 2020-01-07 ENCOUNTER — Telehealth: Payer: Self-pay | Admitting: Internal Medicine

## 2020-01-07 MED ORDER — ALBUTEROL SULFATE HFA 108 (90 BASE) MCG/ACT IN AERS: 2.0000 | INHALATION_SPRAY | RESPIRATORY_TRACT | 1 refills | Status: DC | PRN

## 2020-01-07 NOTE — Telephone Encounter (Signed)
Spoke with patient, advised that she call her insurance company and find out what is on the preferred list of inhalers.  She stated she would call them.  Patient will call and give [place] the list of preferred maintenance inhalers.  Her albuterol inhaler was expired, she was last seen in May, albuterol inhaler sent to pharmacy with instructions to fill insurance preference.

## 2020-01-13 NOTE — Telephone Encounter (Signed)
Called and LMTCB for the pt

## 2020-01-14 NOTE — Telephone Encounter (Signed)
LMTCB x2 for pt 

## 2020-01-18 NOTE — Telephone Encounter (Signed)
LMTCB x3 for pt. We have attempted to contact pt several times with no success or call back from pt. Per triage protocol, message will be closed.   

## 2020-01-29 ENCOUNTER — Other Ambulatory Visit: Payer: Self-pay | Admitting: Cardiovascular Disease

## 2020-01-29 ENCOUNTER — Other Ambulatory Visit (HOSPITAL_COMMUNITY): Payer: Managed Care, Other (non HMO)

## 2020-01-29 DIAGNOSIS — I7300 Raynaud's syndrome without gangrene: Secondary | ICD-10-CM

## 2020-02-25 ENCOUNTER — Other Ambulatory Visit (HOSPITAL_COMMUNITY)
Admission: RE | Admit: 2020-02-25 | Discharge: 2020-02-25 | Disposition: A | Discharge status: Home / Self Care | Payer: Managed Care, Other (non HMO) | Source: Ambulatory Visit | Attending: Internal Medicine | Admitting: Internal Medicine

## 2020-02-25 DIAGNOSIS — Z01812 Encounter for preprocedural laboratory examination: Secondary | ICD-10-CM | POA: Diagnosis present

## 2020-02-25 DIAGNOSIS — Z20822 Contact with and (suspected) exposure to covid-19: Secondary | ICD-10-CM | POA: Diagnosis not present

## 2020-02-25 LAB — SARS CORONAVIRUS 2 (TAT 6-24 HRS): SARS Coronavirus 2: NEGATIVE

## 2020-02-28 ENCOUNTER — Ambulatory Visit (INDEPENDENT_AMBULATORY_CARE_PROVIDER_SITE_OTHER): Payer: Managed Care, Other (non HMO) | Admitting: Internal Medicine

## 2020-02-28 ENCOUNTER — Other Ambulatory Visit: Payer: Self-pay

## 2020-02-28 ENCOUNTER — Telehealth: Payer: Self-pay | Admitting: Internal Medicine

## 2020-02-28 DIAGNOSIS — J439 Emphysema, unspecified: Secondary | ICD-10-CM | POA: Diagnosis not present

## 2020-02-28 LAB — PULMONARY FUNCTION TEST
DL/VA % pred: 47 %
DL/VA: 1.93 ml/min/mmHg/L
DLCO cor % pred: 40 %
DLCO cor: 9.55 ml/min/mmHg
DLCO unc % pred: 40 %
DLCO unc: 9.55 ml/min/mmHg
FEF 25-75 Post: 0.58 L/sec
FEF 25-75 Pre: 0.56 L/sec
FEF2575-%Change-Post: 4 %
FEF2575-%Pred-Post: 21 %
FEF2575-%Pred-Pre: 20 %
FEV1-%Change-Post: -1 %
FEV1-%Pred-Post: 39 %
FEV1-%Pred-Pre: 40 %
FEV1-Post: 1.2 L
FEV1-Pre: 1.22 L
FEV1FVC-%Change-Post: -8 %
FEV1FVC-%Pred-Pre: 66 %
FEV6-%Change-Post: 5 %
FEV6-%Pred-Post: 63 %
FEV6-%Pred-Pre: 60 %
FEV6-Post: 2.42 L
FEV6-Pre: 2.29 L
FEV6FVC-%Change-Post: -1 %
FEV6FVC-%Pred-Post: 98 %
FEV6FVC-%Pred-Pre: 100 %
FVC-%Change-Post: 7 %
FVC-%Pred-Post: 64 %
FVC-%Pred-Pre: 59 %
FVC-Post: 2.53 L
FVC-Pre: 2.35 L
Post FEV1/FVC ratio: 48 %
Post FEV6/FVC ratio: 96 %
Pre FEV1/FVC ratio: 52 %
Pre FEV6/FVC Ratio: 98 %
RV % pred: 181 %
RV: 3.94 L
TLC % pred: 118 %
TLC: 6.79 L

## 2020-02-28 NOTE — Progress Notes (Signed)
Abnormal PFT prelim. Will discuss Sept 2021 visit

## 2020-02-28 NOTE — Progress Notes (Signed)
PFT done today. 

## 2020-02-28 NOTE — Telephone Encounter (Signed)
ATC Patient.  LM to call back when available.  

## 2020-03-01 NOTE — Telephone Encounter (Signed)
Called and spoke with pt who is requesting to have a different inhaler than Bevespi to be sent to the pharmacy as Charolotte Eke she was told is not covered now by insurance.  Routing this to Pharmacy team for help to see which inhalers are covered by pt's insurance so then we could provide this info to MR.

## 2020-03-01 NOTE — Telephone Encounter (Signed)
Plan prefers Anoro- Copay for 1 month supply is $90.00, patient can sign up for Anoro copay card- will pay up to $100 per 30 days.  Plan also prefers [place] for 1 month supply is $9.99 with electronic copay card that automatically applies to claim. If card does not apply automatically at patient's pharmacy, she can sign up for a copay card through [employer].

## 2020-03-01 NOTE — Telephone Encounter (Signed)
Patient calling back regarding inhaler rx - pt can be reached at [phone]

## 2020-03-01 NOTE — Telephone Encounter (Signed)
MR, please advise on this. 

## 2020-03-01 NOTE — Telephone Encounter (Signed)
Ok go with stiolto 2 puff once daily

## 2020-03-02 MED ORDER — STIOLTO RESPIMAT 2.5-2.5 MCG/ACT IN AERS: 2.0000 | INHALATION_SPRAY | Freq: Every day | RESPIRATORY_TRACT | 6 refills | Status: DC

## 2020-03-02 NOTE — Telephone Encounter (Signed)
Called and spoke with patient to let her know that our pharmacy team ran claims on a few different inhalers and that we were going to send in RX for Stiolto that is 2 puffs once daily. Per Rachael: Plan also prefers [place] for 1 month supply is $9.99 with electronic copay card that automatically applies to claim. If card does not apply automatically at patient's pharmacy, she can sign up for a copay card through [employer].  She expressed understanding. RX has been sent. Nothing further needed at this time.

## 2020-03-21 ENCOUNTER — Ambulatory Visit: Payer: Managed Care, Other (non HMO) | Admitting: Internal Medicine

## 2020-03-23 ENCOUNTER — Encounter: Payer: Self-pay | Admitting: Internal Medicine

## 2020-03-23 ENCOUNTER — Other Ambulatory Visit: Payer: Self-pay | Admitting: Internal Medicine

## 2020-03-23 ENCOUNTER — Other Ambulatory Visit: Payer: Self-pay

## 2020-03-23 ENCOUNTER — Ambulatory Visit (INDEPENDENT_AMBULATORY_CARE_PROVIDER_SITE_OTHER): Payer: Managed Care, Other (non HMO) | Admitting: Internal Medicine

## 2020-03-23 VITALS — BP 110/58 | HR 61 | Temp 96.0°F | Ht 68.0 in | Wt 116.6 lb

## 2020-03-23 DIAGNOSIS — J449 Chronic obstructive pulmonary disease, unspecified: Secondary | ICD-10-CM | POA: Diagnosis not present

## 2020-03-23 DIAGNOSIS — Z8679 Personal history of other diseases of the circulatory system: Secondary | ICD-10-CM | POA: Diagnosis not present

## 2020-03-23 DIAGNOSIS — F17200 Nicotine dependence, unspecified, uncomplicated: Secondary | ICD-10-CM

## 2020-03-23 DIAGNOSIS — Z7189 Other specified counseling: Secondary | ICD-10-CM

## 2020-03-23 DIAGNOSIS — R918 Other nonspecific abnormal finding of lung field: Secondary | ICD-10-CM | POA: Diagnosis not present

## 2020-03-23 DIAGNOSIS — Z7185 Encounter for immunization safety counseling: Secondary | ICD-10-CM

## 2020-03-23 MED ORDER — VARENICLINE TARTRATE 0.5 MG X 11 & 1 MG X 42 PO MISC: ORAL | 0 refills | Status: DC

## 2020-03-23 NOTE — Progress Notes (Signed)
OV 11/08/2019  Subjective:  Patient ID: [name], female , DOB: 1961/05/24 , age 59 y.o. , MRN: [id number] , ADDRESS: [place] [id number]   11/08/2019 -   Chief Complaint  Patient presents with  . Consult    Pt states she has been smoking x30 years and due to this, she does have occ SOB. Pt had a CT performed which she stated showed emphysema.     HPI [name] 59 y.o. -referred by primary care for COPD evaluation and diagnosis.  According to the patient she has been at baseline health.  She has a longstanding history of multiple decades of Raynaud's.  She has seen Dr. Dossie Der once but does not want to visit there again.  She has had blood work for that.  In the remote past she is to abuse cocaine but currently smokes marijuana fume times a month.  She is also continued heavy smoker.  She works in [employer].  She is 1 pack a day for 35 years tobacco smoker.  For the last year she has had insidious onset of shortness of breath and cough that is progressively getting worse.  Severity symptoms are listed below.  Prior to that she was fine although earlier chest x-ray there personally visualized shows hyperinflation consistent with COPD.  She had a CT scan of the chest for lung cancer screening that I personally visualized shows emphysema no lung cancer no nodules.  Therefore she has been referred here.  She was given Spiriva but she has not started this.  She says this because of expense although she cannot specify how expensive it is.  In the past year she has tried Chantix for quitting smoking but has not been able to quit smoking.  She says it did work.  She says it caused some constipation.  She is wearing for me to manage her quitting smoking because she wants to really quit smoking.  She says the Chantix can also help quit marijuana use.     CAT COPD Symptom & Quality of Life Score (GSK trademark) 0 is no burden. 5 is highest burden  11/08/2019   Never Cough -> Cough all the time 5  No phlegm in chest -> Chest is full of phlegm 5  No chest tightness -> Chest feels very tight 1  No dyspnea for 1 flight stairs/hill -> Very dyspneic for 1 flight of stairs 4  No limitations for ADL at home -> Very limited with ADL at home 3  Confident leaving home -> Not at all confident leaving home 0  Sleep soundly -> Do not sleep soundly because of lung condition 3  Lots of Energy -> No energy at all 3  TOTAL Score (max 40)  24      CAT Score 11/08/2019  Total CAT Score 24    Simple office walk 185 feet x  3 laps goal with forehead probe 11/08/2019   O2 used ra  Number laps completed 3  Comments about pace moderte  Resting Pulse Ox/HR 100% and 66/min  Final Pulse Ox/HR 98% and 102/min  Desaturated </= 88% no  Desaturated <= 3% points no  Got Tachycardic >/= 90/min yes  Symptoms at end of test Mild dyspnea  Miscellaneous comments x      ROS - per HPI   CT chest 08/31/2019 Lungs/Pleura: Centrilobular and paraseptal emphysema evident. Biapical pleuroparenchymal scarring noted. Numerous bilateral scattered calcified and noncalcified  pulmonary nodules are identified. Dominant noncalcified pulmonary nodule is in the left costophrenic sulcus with volume derived equivalent diameter of 6.6 mm on image 320. No focal airspace consolidation. No pleural effusion.  Upper Abdomen: Unremarkable  Musculoskeletal: No worrisome lytic or sclerotic osseous abnormality.  IMPRESSION: 1. Lung-RADS category 3, probably benign findings. Short-term follow-up in 6 months is recommended with repeat low-dose chest CT without contrast (please use the following order, "CT CHEST LCS NODULE FOLLOW-UP /O CM"). 2.  Emphysema (ICD10-J43.9) and Aortic Atherosclerosis (ICD10-170.0) #3.coronary artery calcification present.  Electronically Signed   By: Misty Stanley M.D.   On: 08/31/2019 12:46    OV 03/23/2020  Subjective:  Patient ID:  [name], female , DOB: 03-21-1961 , age 1 y.o. , MRN: [id number] , ADDRESS: [place] [id number]   03/23/2020 -   Chief Complaint  Patient presents with  . Follow-up    pt is here to go over pft . pt has questions about stiolto     HPI [name] 59 y.o. -presents for evaluation of her COPD lung nodules and smoking.  She has not had a flu shot Covid vaccine.  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  She quit smoking but then in summer 2020 when there was a recall on the Chantix because of manufacturing defect with excess levels of nitrosamines.  At this point in time according to history there is no more Chantix being manufactured.  However she wants to start the Chantix if it is available.  She is willing to take another prescription.  In terms of her COPD she had pulmonary function test is gold stage III severe COPD.  She is currently on Stiolto.  There is the cheapest inhaler for her.  It is working well.  She had questions about inhaler technique.  This is because some missed comes back.  We went over this.  Last visit I ordered alpha-1 phenotype but looks like it has not been done.  She does not recollect having had blood work for this at last visit.  She did have a CT scan of the chest in February 2021.  Shows some pulmonary nodules.  A 30-month CT chest is recommended.  She is willing to have this.     PFT Results Latest Ref Rng & Units 02/28/2020  FVC-Pre L 2.35  FVC-Predicted Pre % 59  FVC-Post L 2.53  FVC-Predicted Post % 64  Pre FEV1/FVC % % 52  Post FEV1/FCV % % 48  FEV1-Pre L 1.22  FEV1-Predicted Pre % 40  FEV1-Post L 1.20  DLCO uncorrected ml/min/mmHg 9.55  DLCO UNC% % 40  DLCO corrected ml/min/mmHg 9.55  DLCO COR %Predicted % 40  DLVA Predicted % 47  TLC L 6.79  TLC % Predicted % 118  RV % Predicted % 181     ROS - per HPI     has a past medical history of Raynaud disease and Tobacco use.   reports that she has been smoking  cigarettes. She has a 60.00 pack-year smoking history. She has never used smokeless tobacco.  Past Surgical History:  Procedure Laterality Date  . CESAREAN SECTION    . IR RADIOLOGIST EVAL & MGMT  09/10/2016    No Known Allergies   There is no immunization history on file for this patient.  No family history on file.   Current Outpatient Medications:  .  albuterol (VENTOLIN HFA) 108 (90 Base) MCG/ACT inhaler, Inhale 2 puffs into the  lungs every 4 (four) hours as needed for wheezing or shortness of breath., Disp: 18 g, Rfl: 1 .  amLODipine (NORVASC) 5 MG tablet, TAKE 1 TABLET BY MOUTH EVERY DAY, Disp: 90 tablet, Rfl: 2 .  aspirin EC 81 MG tablet, Take 1 tablet (81 mg total) by mouth daily., Disp: , Rfl:  .  atorvastatin (LIPITOR) 20 MG tablet, Take 1 tablet (20 mg total) by mouth daily., Disp: 90 tablet, Rfl: 1 .  ibuprofen (ADVIL,MOTRIN) 200 MG tablet, Take 600 mg by mouth every 6 (six) hours as needed. pain, Disp: , Rfl:  .  LORazepam (ATIVAN) 0.5 MG tablet, 1 2 TABLETS TWICE A DAY AS NEEDED FOR ANXIETY ORALLY 30 DAYS, Disp: , Rfl:  .  Multiple Vitamin (MULTIVITAMIN WITH MINERALS) TABS, Take 1 tablet by mouth daily., Disp: , Rfl:  .  Tiotropium Bromide-Olodaterol (STIOLTO RESPIMAT) 2.5-2.5 MCG/ACT AERS, Inhale 2 puffs into the lungs daily., Disp: 4 g, Rfl: 6      Objective:   Vitals:   03/23/20 0924  BP: (!) 110/58  Pulse: 61  Temp: (!) 96 F (35.6 C)  TempSrc: Oral  SpO2: 91%  Weight: 116 lb 9.6 oz (52.9 kg)  Height: 5\' 8"  (1.727 m)    Estimated body mass index is 17.73 kg/m as calculated from the following:   Height as of this encounter: 5\' 8"  (1.727 m).   Weight as of this encounter: 116 lb 9.6 oz (52.9 kg).  @WEIGHTCHANGE @  [employer]   03/23/20 0924  Weight: 116 lb 9.6 oz (52.9 kg)     Physical Exam Neuro: Alert and Oriented x 3. GCS 15. Speech normal Psych: Pleasant Resp: Clear to ausucultation bilaterally. No wheeze No crackles HEENT: Normal upper  airway. PEERL +. No post nasal drip CVS: Normal heart sounds    Assessment:       ICD-10-CM   1. Stage 3 severe COPD by GOLD classification ([place])  J44.9   2. Smoking  F17.200   3. History of Raynaud's syndrome  Z86.79   4. Multiple lung nodules on CT  R91.8   5. Vaccine counseling  Z71.89        Plan:     Patient Instructions  Pulmonary emphysema, unspecified emphysema type ([place]) Gold stage 3 copd  - I think tobacco smoking have caused lung damage -Current COPD level is 40% which is considered severe -Glad Stiolto inhaler is helping you -we discussed instructions on how to take Stiolto successfully  plan - Continue Stiolto 2 puffs daily - do albuterol as needed - check alpha 1 AT phenotype 03/23/2020 -blood work   Smoking Tobacco Marijuana smoker  -Too bad smoking relapse after Chantix recall from a manufacturing process issue with nitrosamines in July 2021  #SMoking  - glad you want to quit  -  rwstart chantix as directed -but it is possible that further manufacturing is still on hold  - Days 1-3: 0.5 mg once daily  - Days 4-7: 0.5 mg twice daily  - Maintenance (? Day 8):  1 mg twice daily for 11 weeks   - if you have bad dream stop medication and call us  - take the medicaiton as instructed and after food. Drink water - quit smoking  1 week after starting chantix     Multiple pulmonary nodules on CT scan chest February 2021  Plan  -Do CT scan of the chest without contrast in the next few to several weeks  Vaccine counseling   - respect decision  to decline respiratory vaccines  Follow-up -6 weeks with nurse practitioner to discuss quitting smoking progress and also to  - follow-up the results of the CT scan      SIGNATURE    Dr. Brand Males, M.D., F.C.C.P,  Pulmonary and Critical Care Medicine Staff Physician, [place] Director - Interstitial Lung Disease  Program  [facility] at  [place], [place], [address]  Pager: [phone], If no answer or between  15:00h - 7:00h: call [phone] Telephone: [phone]  9:49 AM 03/23/2020

## 2020-03-23 NOTE — Patient Instructions (Addendum)
Pulmonary emphysema, unspecified emphysema type ([place]) Gold stage 3 copd  - I think tobacco smoking have caused lung damage -Current COPD level is 40% which is considered severe -Glad Stiolto inhaler is helping you -we discussed instructions on how to take Stiolto successfully  plan - Continue Stiolto 2 puffs daily - do albuterol as needed - check alpha 1 AT phenotype 03/23/2020 -blood work   Smoking Tobacco Marijuana smoker  -Too bad smoking relapse after Chantix recall from a manufacturing process issue with nitrosamines in July 2021  #SMoking  - glad you want to quit  -  rwstart chantix as directed -but it is possible that further manufacturing is still on hold  - Days 1-3: 0.5 mg once daily  - Days 4-7: 0.5 mg twice daily  - Maintenance (? Day 8):  1 mg twice daily for 11 weeks   - if you have bad dream stop medication and call us  - take the medicaiton as instructed and after food. Drink water - quit smoking  1 week after starting chantix     Multiple pulmonary nodules on CT scan chest February 2021  Plan  -Do CT scan of the chest without contrast in the next few to several weeks  Vaccine counseling   - respect decision to decline respiratory vaccines  Follow-up -6 weeks with nurse practitioner to discuss quitting smoking progress and also to  - follow-up the results of the CT scan

## 2020-03-29 ENCOUNTER — Ambulatory Visit (INDEPENDENT_AMBULATORY_CARE_PROVIDER_SITE_OTHER): Payer: Managed Care, Other (non HMO) | Admitting: Acute Care

## 2020-03-29 ENCOUNTER — Other Ambulatory Visit: Payer: Self-pay

## 2020-03-29 ENCOUNTER — Encounter: Payer: Self-pay | Admitting: Acute Care

## 2020-03-29 DIAGNOSIS — R911 Solitary pulmonary nodule: Secondary | ICD-10-CM

## 2020-03-29 DIAGNOSIS — Z720 Tobacco use: Secondary | ICD-10-CM

## 2020-03-29 NOTE — Progress Notes (Signed)
Peer to Peer   I spoke with physician representing insurance carrier.  They had denied a 6 month follow up of a Lung  RADS 3, nodule that was 6.6 mm , noted on the patient's 08/31/2019 Lung Cancer Screening scan. This patient is a current every day smoker with a 60 pack year smoking history, therefore high risk.  Per the physician, they did not have the results of the 08/31/2019 scan.  The request was approved.   Paperwork and approval number were given to [facility])   The patient will be scheduled for their follow up Low Dose CT Chest/  Magdalen Spatz, MSN, AGACNP-BC [place] for personal pager PCCM on call pager [phone] 03/29/2020 11:48 AM

## 2020-04-03 ENCOUNTER — Inpatient Hospital Stay: Admission: RE | Admit: 2020-04-03 | Payer: Managed Care, Other (non HMO) | Source: Ambulatory Visit

## 2020-04-04 LAB — ALPHA-1 ANTITRYPSIN PHENOTYPE: A-1 Antitrypsin, Ser: 154 mg/dL (ref 83–199)

## 2020-04-14 ENCOUNTER — Ambulatory Visit: Payer: Managed Care, Other (non HMO)

## 2020-04-25 ENCOUNTER — Other Ambulatory Visit: Payer: Self-pay | Admitting: Internal Medicine

## 2020-05-01 ENCOUNTER — Telehealth: Payer: Self-pay | Admitting: Internal Medicine

## 2020-05-01 NOTE — Telephone Encounter (Signed)
Called and spoke with patient who states she got letter in the mail for her Stiolto to be 3 month supply and not 1. She verified pharmacy. When I went to refill medication it was already prescribed for 3 month supply. Called pharmacy to confirm. Nothing further needed at this time.

## 2020-05-04 ENCOUNTER — Ambulatory Visit: Payer: Managed Care, Other (non HMO) | Admitting: Adult Health

## 2020-06-01 NOTE — Progress Notes (Signed)
Last seen 03/22/20. At that time we checked alpha 1 - phenotype and level. 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 She was supposed to come back and see APP for her copd but I do not see followup. Pleaes give non-urgen followup to see app first available

## 2020-10-09 DIAGNOSIS — F17210 Nicotine dependence, cigarettes, uncomplicated: Secondary | ICD-10-CM | POA: Diagnosis not present

## 2020-10-09 DIAGNOSIS — J441 Chronic obstructive pulmonary disease with (acute) exacerbation: Secondary | ICD-10-CM | POA: Diagnosis not present

## 2020-10-12 DIAGNOSIS — Z20822 Contact with and (suspected) exposure to covid-19: Secondary | ICD-10-CM | POA: Diagnosis not present

## 2020-10-27 DIAGNOSIS — I700 Atherosclerosis of aorta: Secondary | ICD-10-CM | POA: Diagnosis not present

## 2020-10-27 DIAGNOSIS — F321 Major depressive disorder, single episode, moderate: Secondary | ICD-10-CM | POA: Diagnosis not present

## 2020-10-27 DIAGNOSIS — F419 Anxiety disorder, unspecified: Secondary | ICD-10-CM | POA: Diagnosis not present

## 2020-10-27 DIAGNOSIS — E7800 Pure hypercholesterolemia, unspecified: Secondary | ICD-10-CM | POA: Diagnosis not present

## 2021-01-16 ENCOUNTER — Other Ambulatory Visit: Payer: Self-pay | Admitting: Cardiovascular Disease

## 2021-01-16 DIAGNOSIS — I7300 Raynaud's syndrome without gangrene: Secondary | ICD-10-CM

## 2021-01-17 NOTE — Telephone Encounter (Signed)
[facility] Pt.

## 2021-04-13 DIAGNOSIS — E7800 Pure hypercholesterolemia, unspecified: Secondary | ICD-10-CM | POA: Diagnosis not present

## 2021-04-13 DIAGNOSIS — Z1211 Encounter for screening for malignant neoplasm of colon: Secondary | ICD-10-CM | POA: Diagnosis not present

## 2021-04-13 DIAGNOSIS — F321 Major depressive disorder, single episode, moderate: Secondary | ICD-10-CM | POA: Diagnosis not present

## 2021-04-13 DIAGNOSIS — F419 Anxiety disorder, unspecified: Secondary | ICD-10-CM | POA: Diagnosis not present

## 2021-04-13 DIAGNOSIS — I700 Atherosclerosis of aorta: Secondary | ICD-10-CM | POA: Diagnosis not present

## 2021-05-03 ENCOUNTER — Telehealth: Payer: Self-pay | Admitting: Internal Medicine

## 2021-05-03 DIAGNOSIS — J849 Interstitial pulmonary disease, unspecified: Secondary | ICD-10-CM

## 2021-05-03 DIAGNOSIS — R0609 Other forms of dyspnea: Secondary | ICD-10-CM

## 2021-05-03 NOTE — Telephone Encounter (Signed)
Patient is returning phone call. Patient phone number is [phone].

## 2021-05-03 NOTE — Telephone Encounter (Signed)
Spoke with the pt  She is c/o increased SOB over the past month  She states that sometimes when she gets winded she loses urinary continence  She is still taking her stiolto and using albuterol inhaler  Denies any other symptoms  PCP notified of urinary issue and advised her to f/u with [place] bc this is likely due to her breathlessness She was scheduled with MR for 05/25/21  I offered her a sooner appt and she refused  She can only come m-f and only wants to see MR  I advised I will let him know about her symptoms but she is due for ov and should keep appt for eval

## 2021-05-03 NOTE — Telephone Encounter (Signed)
    Increased doe x 1 month Has COPD Last PFT Aug 2021 Last CT FEb 2021 Last cbc/bmet 2013  Plan  - get spiro/dlco  - get HRCT supine/prone - get cbc, bmet, lft (unless present last 3 months with PCP)  - both before seeing me next

## 2021-05-03 NOTE — Telephone Encounter (Signed)
Called and there was no answer- LMTCB 

## 2021-05-03 NOTE — Telephone Encounter (Signed)
Lm x1 for patient.  

## 2021-05-07 NOTE — Telephone Encounter (Signed)
ATC and left a message for the patient to call back.

## 2021-05-07 NOTE — Telephone Encounter (Signed)
ATC LVMTCB X1  

## 2021-05-08 NOTE — Telephone Encounter (Signed)
Spoke with the pt  She is aware of response per MR  She is scheduled for 05/25/21 and there are no openings for PFT before then  She agrees to have HRCT and labs done prior and orders placed for these  Nothing further needed

## 2021-05-16 ENCOUNTER — Other Ambulatory Visit: Payer: Managed Care, Other (non HMO)

## 2021-05-17 ENCOUNTER — Ambulatory Visit (INDEPENDENT_AMBULATORY_CARE_PROVIDER_SITE_OTHER)
Admission: RE | Admit: 2021-05-17 | Discharge: 2021-05-17 | Disposition: A | Discharge status: Home / Self Care | Payer: BC Managed Care – PPO | Source: Ambulatory Visit | Attending: Internal Medicine | Admitting: Internal Medicine

## 2021-05-17 ENCOUNTER — Other Ambulatory Visit: Payer: Self-pay

## 2021-05-17 DIAGNOSIS — R0609 Other forms of dyspnea: Secondary | ICD-10-CM

## 2021-05-17 DIAGNOSIS — J439 Emphysema, unspecified: Secondary | ICD-10-CM | POA: Diagnosis not present

## 2021-05-25 ENCOUNTER — Ambulatory Visit: Payer: Managed Care, Other (non HMO) | Admitting: Internal Medicine

## 2021-05-29 DIAGNOSIS — Z1231 Encounter for screening mammogram for malignant neoplasm of breast: Secondary | ICD-10-CM | POA: Diagnosis not present

## 2021-05-29 DIAGNOSIS — Z681 Body mass index (BMI) 19 or less, adult: Secondary | ICD-10-CM | POA: Diagnosis not present

## 2021-05-29 DIAGNOSIS — Z01419 Encounter for gynecological examination (general) (routine) without abnormal findings: Secondary | ICD-10-CM | POA: Diagnosis not present

## 2021-06-05 ENCOUNTER — Other Ambulatory Visit: Payer: Self-pay | Admitting: Obstetrics and Gynecology

## 2021-06-05 DIAGNOSIS — Z800 Family history of malignant neoplasm of digestive organs: Secondary | ICD-10-CM

## 2021-06-05 DIAGNOSIS — R928 Other abnormal and inconclusive findings on diagnostic imaging of breast: Secondary | ICD-10-CM

## 2021-06-08 ENCOUNTER — Telehealth: Payer: Self-pay | Admitting: Internal Medicine

## 2021-06-08 DIAGNOSIS — J449 Chronic obstructive pulmonary disease, unspecified: Secondary | ICD-10-CM

## 2021-06-08 DIAGNOSIS — R0602 Shortness of breath: Secondary | ICD-10-CM

## 2021-06-08 NOTE — Telephone Encounter (Signed)
LMTCB   When patient calls back please schedule 30 minute Pft and tell her to bring covid vaccination cards day of appt.

## 2021-06-26 NOTE — Telephone Encounter (Signed)
Pt scheduled for PFT for 07/27/21. Will close encounter.

## 2021-07-24 DIAGNOSIS — Z20822 Contact with and (suspected) exposure to covid-19: Secondary | ICD-10-CM | POA: Diagnosis not present

## 2021-07-27 ENCOUNTER — Encounter: Payer: Self-pay | Admitting: Internal Medicine

## 2021-07-27 ENCOUNTER — Other Ambulatory Visit: Payer: Self-pay

## 2021-07-27 ENCOUNTER — Ambulatory Visit (INDEPENDENT_AMBULATORY_CARE_PROVIDER_SITE_OTHER): Payer: BC Managed Care – PPO | Admitting: Internal Medicine

## 2021-07-27 VITALS — BP 108/64 | HR 58 | Temp 97.9°F | Ht 68.5 in | Wt 122.0 lb

## 2021-07-27 DIAGNOSIS — Z7185 Encounter for immunization safety counseling: Secondary | ICD-10-CM | POA: Diagnosis not present

## 2021-07-27 DIAGNOSIS — F17210 Nicotine dependence, cigarettes, uncomplicated: Secondary | ICD-10-CM

## 2021-07-27 DIAGNOSIS — J449 Chronic obstructive pulmonary disease, unspecified: Secondary | ICD-10-CM

## 2021-07-27 DIAGNOSIS — R0602 Shortness of breath: Secondary | ICD-10-CM

## 2021-07-27 DIAGNOSIS — Z720 Tobacco use: Secondary | ICD-10-CM

## 2021-07-27 LAB — PULMONARY FUNCTION TEST
DL/VA % pred: 43 %
DL/VA: 1.77 ml/min/mmHg/L
DLCO cor % pred: 36 %
DLCO cor: 8.56 ml/min/mmHg
DLCO unc % pred: 36 %
DLCO unc: 8.56 ml/min/mmHg
FEF 25-75 Pre: 0.57 L/sec
FEF2575-%Pred-Pre: 21 %
FEV1-%Pred-Pre: 45 %
FEV1-Pre: 1.36 L
FEV1FVC-%Pred-Pre: 66 %
FEV6-%Pred-Pre: 68 %
FEV6-Pre: 2.55 L
FEV6FVC-%Pred-Pre: 101 %
FVC-%Pred-Pre: 67 %
FVC-Pre: 2.61 L
Pre FEV1/FVC ratio: 52 %
Pre FEV6/FVC Ratio: 98 %

## 2021-07-27 MED ORDER — VARENICLINE TARTRATE 0.5 MG X 11 & 1 MG X 42 PO TBPK: ORAL_TABLET | ORAL | 0 refills | Status: DC

## 2021-07-27 NOTE — Progress Notes (Signed)
Spirometry/DLCO performed today. 

## 2021-07-27 NOTE — Patient Instructions (Signed)
Stage 3 severe COPD by GOLD classification ([place])  - stable on PFT and symptoms  Plan  - continue stiolto schedule with albuterol as needed  Tobacco abuse  Glad you want to quit smoking  Plan  - #SMoking  - glad you want to quit and I understand you are using expired samples -  start chantix as directed  - Days 1-3: 0.5 mg once daily  - Days 4-7: 0.5 mg twice daily  - Maintenance (? Day 8):  1 mg twice daily for 11 weeks   - if you have bad dream stop medication and call us  - take the medicaiton as instructed and after food - quit smoking  10 days after starting chantix  - but reducing 2 cigs/day   Vaccine counseling  - respect deferral of flu and covid vaccine  Followup  4 weeks video visit with APP to disucss quitting smoking 6 monts Dr Chase Caller or soner if needed

## 2021-07-27 NOTE — Patient Instructions (Signed)
Spirometry/DLCO performed today. 

## 2021-07-27 NOTE — Progress Notes (Signed)
OV 11/08/2019  Subjective:  Patient ID: [name], female , DOB: October 09, 1960 , age 61 y.o. , MRN: [id number] , ADDRESS: [place] [id number]   11/08/2019 -   Chief Complaint  Patient presents with   Consult    Pt states she has been smoking x30 years and due to this, she does have occ SOB. Pt had a CT performed which she stated showed emphysema.     HPI [name] 61 y.o. -referred by primary care for COPD evaluation and diagnosis.  According to the patient she has been at baseline health.  She has a longstanding history of multiple decades of Raynaud's.  She has seen Dr. Dossie Der once but does not want to visit there again.  She has had blood work for that.  In the remote past she is to abuse cocaine but currently smokes marijuana fume times a month.  She is also continued heavy smoker.  She works in [employer].  She is 1 pack a day for 35 years tobacco smoker.  For the last year she has had insidious onset of shortness of breath and cough that is progressively getting worse.  Severity symptoms are listed below.  Prior to that she was fine although earlier chest x-ray there personally visualized shows hyperinflation consistent with COPD.  She had a CT scan of the chest for lung cancer screening that I personally visualized shows emphysema no lung cancer no nodules.  Therefore she has been referred here.  She was given Spiriva but she has not started this.  She says this because of expense although she cannot specify how expensive it is.  In the past year she has tried Chantix for quitting smoking but has not been able to quit smoking.  She says it did work.  She says it caused some constipation.  She is wearing for me to manage her quitting smoking because she wants to really quit smoking.  She says the Chantix can also help quit marijuana use.     CT chest 08/31/2019 Lungs/Pleura: Centrilobular and paraseptal emphysema evident. Biapical  pleuroparenchymal scarring noted. Numerous bilateral scattered calcified and noncalcified pulmonary nodules are identified. Dominant noncalcified pulmonary nodule is in the left costophrenic sulcus with volume derived equivalent diameter of 6.6 mm on image 320. No focal airspace consolidation. No pleural effusion.   Upper Abdomen: Unremarkable   Musculoskeletal: No worrisome lytic or sclerotic osseous abnormality.   IMPRESSION: 1. Lung-RADS category 3, probably benign findings. Short-term follow-up in 6 months is recommended with repeat low-dose chest CT without contrast (please use the following order, "CT CHEST LCS NODULE FOLLOW-UP /O CM"). 2.  Emphysema (ICD10-J43.9) and Aortic Atherosclerosis (ICD10-170.0) #3.coronary artery calcification present.   Electronically Signed   By: Misty Stanley M.D.   On: 08/31/2019 12:46     OV 03/23/2020  Subjective:  Patient ID: [name], female , DOB: May 23, 1961 , age 30 y.o. , MRN: [id number] , ADDRESS: [place] [id number]   03/23/2020 -   Chief Complaint  Patient presents with   Follow-up    pt is here to go over pft . pt has questions about stiolto     HPI [name] 61 y.o. -presents for evaluation of her COPD lung nodules and smoking.  She has not had a flu shot Covid vaccine.  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  She quit smoking but then in summer 2020  when there was a recall on the Chantix because of manufacturing defect with excess levels of nitrosamines.  At this point in time according to history there is no more Chantix being manufactured.  However she wants to start the Chantix if it is available.  She is willing to take another prescription.  In terms of her COPD she had pulmonary function test is gold stage III severe COPD.  She is currently on Stiolto.  There is the cheapest inhaler for her.  It is working well.  She had questions about inhaler technique.  This is because some missed comes  back.  We went over this.  Last visit I ordered alpha-1 phenotype but looks like it has not been done.  She does not recollect having had blood work for this at last visit.  She did have a CT scan of the chest in February 2021.  Shows some pulmonary nodules.  A 54-month CT chest is recommended.  She is willing to have this.       OV 07/27/2021  Subjective:  Patient ID: [name], female , DOB: 1960/11/01 , age 45 y.o. , MRN: [id number] , ADDRESS: [place] [id number] PCP Shirline Frees, MD Patient Care Team: Shirline Frees, MD as PCP - General (Family Medicine) Wellington Hampshire, MD as PCP - Cardiology (Cardiology)  This Provider for this visit: Treatment Team:  Attending Provider: Brand Males, MD    07/27/2021 -   Chief Complaint  Patient presents with   Follow-up    PFT performed today. Pt states at times when she becomes SOB, she will have some incontinence issues.   Gold stage 3 copd - Alpha 1 MS  Smoking Lung nodules/cancer screen last CT November 2022  HPI [name] 61 y.o. -returns for follow-up.  Have not seen her in a year and 4 months.  She says she continues to smoke 1 pack/day.  In the past she has tried Chantix and it did not work well for her but she is willing to try it again.  We discussed strategies to quit smoking.  Advised her to cut down smoking by 2 cigarettes/day and by 10 days to be able to quit smoking.  This will be when she starts the Chantix.  We spent a total of 3 minutes talking about quitting smoking.  She does not want to COVID or flu shots.  In terms of her COPD she did have pulmonary function test and it is stable..  November 2022 she did have a CT scan of the chest and lung nodules stable.  They are considered benign.  Only annual low-dose CT scan of the chest is now recommended.  She is happy to hear this news.       CAT COPD Symptom & Quality of Life Score (GSK trademark) 0 is no burden. 5 is highest  burden 11/08/2019   Never Cough -> Cough all the time 5  No phlegm in chest -> Chest is full of phlegm 5  No chest tightness -> Chest feels very tight 1  No dyspnea for 1 flight stairs/hill -> Very dyspneic for 1 flight of stairs 4  No limitations for ADL at home -> Very limited with ADL at home 3  Confident leaving home -> Not at all confident leaving home 0  Sleep soundly -> Do not sleep soundly because of lung condition 3  Lots of Energy -> No energy at all 3  TOTAL Score (max 40)  24      Simple office walk 185 feet x  3 laps goal with forehead probe 11/08/2019   O2 used ra  Number laps completed 3  Comments about pace moderte  Resting Pulse Ox/HR 100% and 66/min  Final Pulse Ox/HR 98% and 102/min  Desaturated </= 88% no  Desaturated <= 3% points no  Got Tachycardic >/= 90/min yes  Symptoms at end of test Mild dyspnea  Miscellaneous comments x      ROS - per HPI   PFT  PFT Results Latest Ref Rng & Units 07/27/2021 02/28/2020  FVC-Pre L 2.61 2.35  FVC-Predicted Pre % 67 59  FVC-Post L - 2.53  FVC-Predicted Post % - 64  Pre FEV1/FVC % % 52 52  Post FEV1/FCV % % - 48  FEV1-Pre L 1.36 1.22  FEV1-Predicted Pre % 45 40  FEV1-Post L - 1.20  DLCO uncorrected ml/min/mmHg 8.56 9.55  DLCO UNC% % 36 40  DLCO corrected ml/min/mmHg 8.56 9.55  DLCO COR %Predicted % 36 40  DLVA Predicted % 43 47  TLC L - 6.79  TLC % Predicted % - 118  RV % Predicted % - 181    CT chest Nov 2022  Narrative & Impression  CLINICAL DATA:  Dyspnea on exertion, emphysema, increased shortness of breath, wheezing, cough   EXAM: CT CHEST WITHOUT CONTRAST   TECHNIQUE: Multidetector CT imaging of the chest was performed following the standard protocol without intravenous contrast. High resolution imaging of the lungs, as well as inspiratory and expiratory imaging, was performed.   COMPARISON:  08/31/2019   FINDINGS: Cardiovascular: Aortic atherosclerosis. Normal heart size. Left  and right coronary artery calcifications. Unchanged small pericardial effusion.   Mediastinum/Nodes: No enlarged mediastinal, hilar, or axillary lymph nodes. Thyroid gland, trachea, and esophagus demonstrate no significant findings.   Lungs/Pleura: Moderate centrilobular and paraseptal emphysema. Diffuse bilateral bronchial wall thickening. Minimal, generally bland appearing scarring and partial atelectasis at the lung bases. No significant air trapping on expiratory phase imaging. Multiple small bilateral pulmonary nodules are stable compared to prior examination and definitively benign. No pleural effusion or pneumothorax.   Upper Abdomen: No acute abnormality.   Musculoskeletal: No chest wall mass or suspicious bone lesions identified.   IMPRESSION: 1. No evidence of fibrotic interstitial lung disease. 2. Moderate emphysema. 3. Diffuse bilateral bronchial wall thickening, consistent with nonspecific infectious or inflammatory bronchitis. 4. Multiple small bilateral pulmonary nodules are stable compared to prior examination and definitively benign. No further routine CT follow-up is required for these nodules. Consider ongoing annual CT lung cancer screening if indicated by patient age, smoking history, and/or other risk factors for lung cancer. 5. Coronary artery disease.   Aortic Atherosclerosis (ICD10-I70.0) and Emphysema (ICD10-J43.9).     Electronically Signed   By: Delanna Ahmadi M.D.   On: 05/18/2021 08:07     has a past medical history of Raynaud disease and Tobacco use.   reports that she has been smoking cigarettes. She has a 60.00 pack-year smoking history. She has never used smokeless tobacco.  Past Surgical History:  Procedure Laterality Date   CESAREAN SECTION     IR RADIOLOGIST EVAL & MGMT  09/10/2016    No Known Allergies   There is no immunization history on file for this patient.  No family history on file.   Current Outpatient Medications:     albuterol (VENTOLIN HFA) 108 (90 Base) MCG/ACT inhaler, Inhale 2 puffs into the lungs every 4 (four) hours as needed for  wheezing or shortness of breath., Disp: 18 g, Rfl: 1   amLODipine (NORVASC) 5 MG tablet, TAKE 1 TABLET BY MOUTH EVERY DAY, Disp: 30 tablet, Rfl: 0   ARIPiprazole (ABILIFY) 5 MG tablet, Take 5 mg by mouth daily., Disp: , Rfl:    aspirin EC 81 MG tablet, Take 1 tablet (81 mg total) by mouth daily., Disp: , Rfl:    atorvastatin (LIPITOR) 20 MG tablet, Take 1 tablet (20 mg total) by mouth daily., Disp: 90 tablet, Rfl: 1   DULoxetine (CYMBALTA) 60 MG capsule, 1 capsule, Disp: , Rfl:    ibuprofen (ADVIL,MOTRIN) 200 MG tablet, Take 600 mg by mouth every 6 (six) hours as needed. pain, Disp: , Rfl:    LORazepam (ATIVAN) 0.5 MG tablet, 1 2 TABLETS TWICE A DAY AS NEEDED FOR ANXIETY ORALLY 30 DAYS, Disp: , Rfl:    Multiple Vitamin (MULTIVITAMIN WITH MINERALS) TABS, Take 1 tablet by mouth daily., Disp: , Rfl:    STIOLTO RESPIMAT 2.5-2.5 MCG/ACT AERS, INHALE 2 PUFFS BY MOUTH INTO THE LUNGS DAILY, Disp: 12 g, Rfl: 3   varenicline (CHANTIX PAK) 0.5 MG X 11 & 1 MG X 42 tablet, Take one 0.5 mg tablet by mouth once daily for 3 days, then increase to one 0.5 mg tablet twice daily for 4 days, then increase to one 1 mg tablet twice daily., Disp: 53 tablet, Rfl: 0      Objective:   Vitals:   07/27/21 1442  BP: 108/64  Pulse: (!) 58  Temp: 97.9 F (36.6 C)  TempSrc: Oral  SpO2: 96%  Weight: 122 lb (55.3 kg)  Height: 5' 8.5" (1.74 m)    Estimated body mass index is 18.28 kg/m as calculated from the following:   Height as of this encounter: 5' 8.5" (1.74 m).   Weight as of this encounter: 122 lb (55.3 kg).  @WEIGHTCHANGE @  [employer]   07/27/21 1442  Weight: 122 lb (55.3 kg)     Physical Exam  General: No distress. Looks well Neuro: Alert and Oriented x 3. GCS 15. Speech normal Psych: Pleasant Resp:  Barrel Chest - no.  Wheeze - no, Crackles - no, No overt respiratory  distress CVS: Normal heart sounds. Murmurs - no Ext: Stigmata of Connective Tissue Disease - no HEENT: Normal upper airway. PEERL +. No post nasal drip.  Mild smell of tobacco        Assessment:       ICD-10-CM   1. Stage 3 severe COPD by GOLD classification ([place])  J44.9     2. Tobacco abuse  Z72.0     3. Vaccine counseling  Z71.85          Plan:     Patient Instructions  Stage 3 severe COPD by GOLD classification ([place])  - stable on PFT and symptoms  Plan  - continue stiolto schedule with albuterol as needed  Tobacco abuse  Glad you want to quit smoking  Plan  - #SMoking  - glad you want to quit and I understand you are using expired samples -  start chantix as directed  - Days 1-3: 0.5 mg once daily  - Days 4-7: 0.5 mg twice daily  - Maintenance (? Day 8):  1 mg twice daily for 11 weeks   - if you have bad dream stop medication and call us  - take the medicaiton as instructed and after food - quit smoking  10 days after starting chantix  - but reducing 2 cigs/day  Vaccine counseling  - respect deferral of flu and covid vaccine  Followup  4 weeks video visit with APP to disucss quitting smoking 6 monts Dr Chase Caller or soner if needed     SIGNATURE    Dr. Brand Males, M.D., F.C.C.P,  Pulmonary and Critical Care Medicine Staff Physician, [place] Director - Interstitial Lung Disease  Program  Pulmonary [facility] [place], [place], [address]  Pager: [phone], If no answer or between  15:00h - 7:00h: call [phone] Telephone: [phone]  5:21 PM 07/27/2021

## 2021-08-24 ENCOUNTER — Telehealth: Payer: BC Managed Care – PPO | Admitting: Nurse Practitioner

## 2021-09-05 DIAGNOSIS — R051 Acute cough: Secondary | ICD-10-CM | POA: Diagnosis not present

## 2021-09-05 DIAGNOSIS — J018 Other acute sinusitis: Secondary | ICD-10-CM | POA: Diagnosis not present

## 2021-10-19 DIAGNOSIS — E7800 Pure hypercholesterolemia, unspecified: Secondary | ICD-10-CM | POA: Diagnosis not present

## 2021-10-19 DIAGNOSIS — F411 Generalized anxiety disorder: Secondary | ICD-10-CM | POA: Diagnosis not present

## 2021-10-19 DIAGNOSIS — I7300 Raynaud's syndrome without gangrene: Secondary | ICD-10-CM | POA: Diagnosis not present

## 2021-10-19 DIAGNOSIS — F321 Major depressive disorder, single episode, moderate: Secondary | ICD-10-CM | POA: Diagnosis not present

## 2021-11-16 DIAGNOSIS — F321 Major depressive disorder, single episode, moderate: Secondary | ICD-10-CM | POA: Diagnosis not present

## 2021-11-16 DIAGNOSIS — F411 Generalized anxiety disorder: Secondary | ICD-10-CM | POA: Diagnosis not present

## 2022-02-22 DIAGNOSIS — N393 Stress incontinence (female) (male): Secondary | ICD-10-CM | POA: Diagnosis not present

## 2022-02-22 DIAGNOSIS — F321 Major depressive disorder, single episode, moderate: Secondary | ICD-10-CM | POA: Diagnosis not present

## 2022-02-22 DIAGNOSIS — F411 Generalized anxiety disorder: Secondary | ICD-10-CM | POA: Diagnosis not present

## 2022-04-04 IMAGING — CT CT CHEST HIGH RESOLUTION
2 of 8 series · 14 of 36 positions shown, 17 images · non-contrast
Comparison: 08/31/2019

CLINICAL DATA: Dyspnea on exertion, emphysema, increased shortness
of breath, wheezing, cough

EXAM:
CT CHEST WITHOUT CONTRAST
TECHNIQUE: Multidetector CT imaging of the chest was performed following the
standard protocol without intravenous contrast. High resolution
imaging of the lungs, as well as inspiratory and expiratory imaging,
was performed.

[Series 5: high resolution · axial · 0.61mm/px · z∈[-352,-54]mm · 11 of 358 slices shown, 14 images]
[im 30/358  mediastinal]
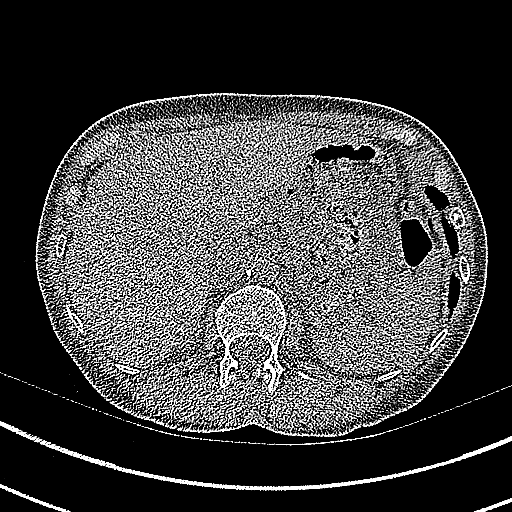
[im 30/358  lung]
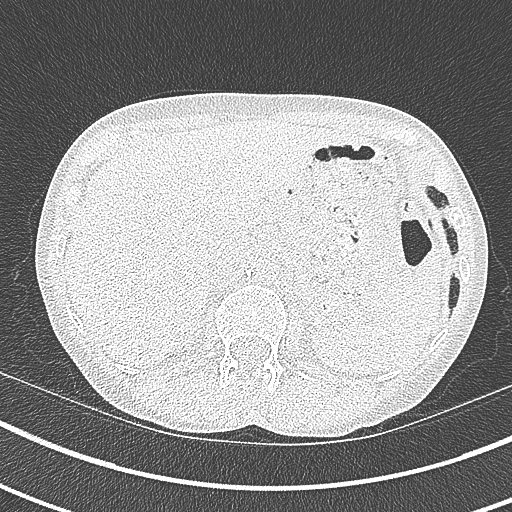
[im 60/358  lung]
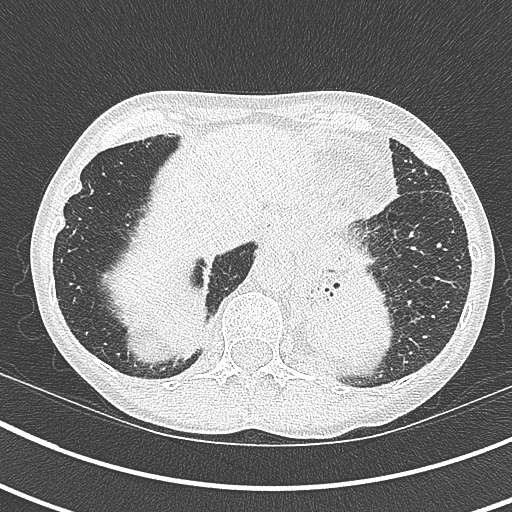
[im 90/358  lung]
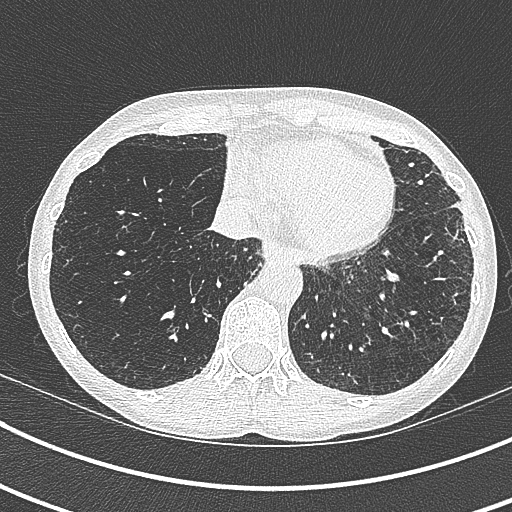
[im 120/358  lung]
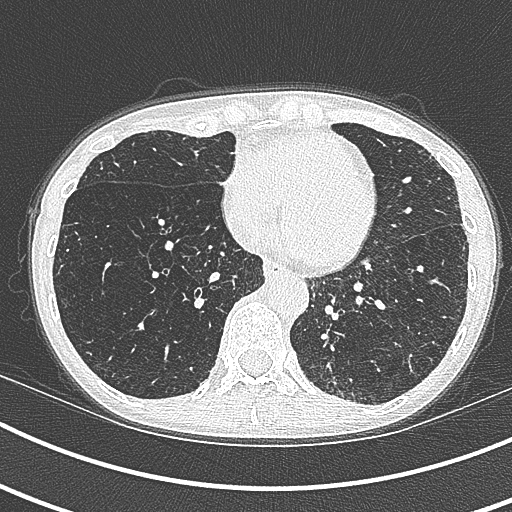
[im 149/358  mediastinal]
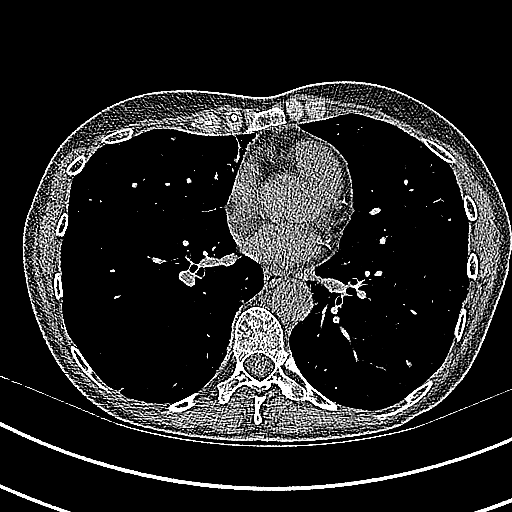
[im 149/358  lung]
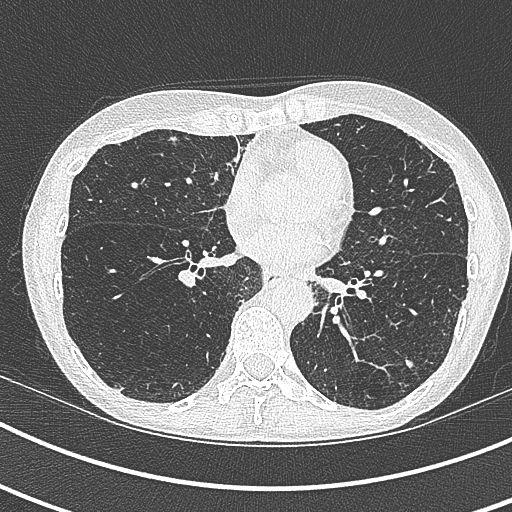
[im 179/358  lung]
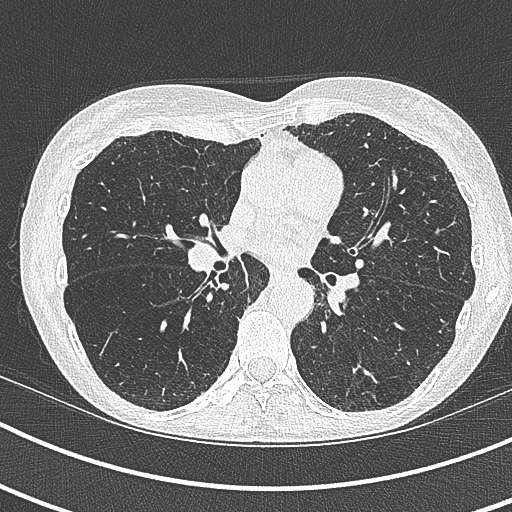
[im 209/358  lung]
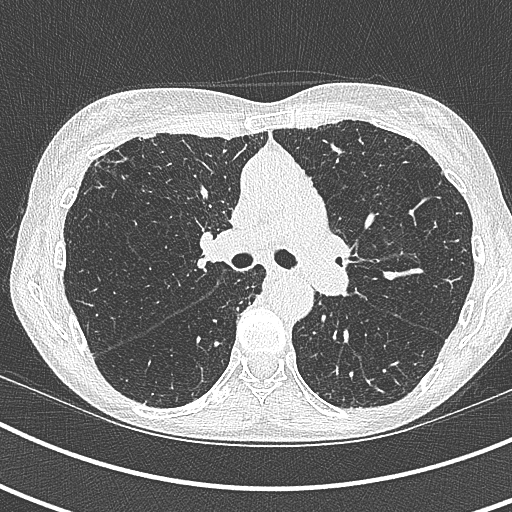
[im 239/358  lung]
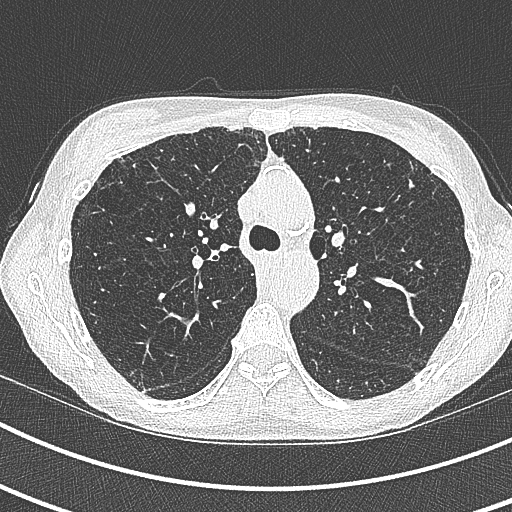
[im 268/358  mediastinal]
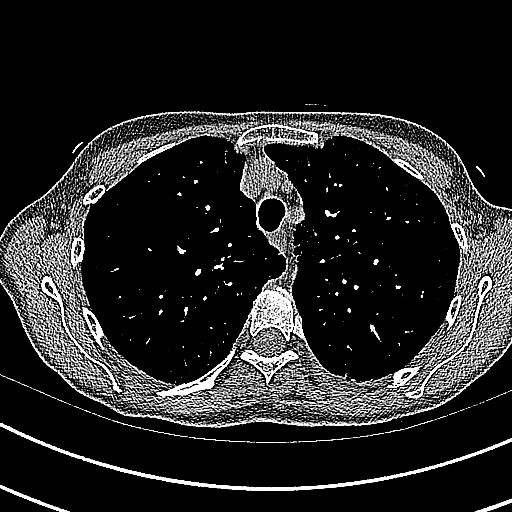
[im 268/358  lung]
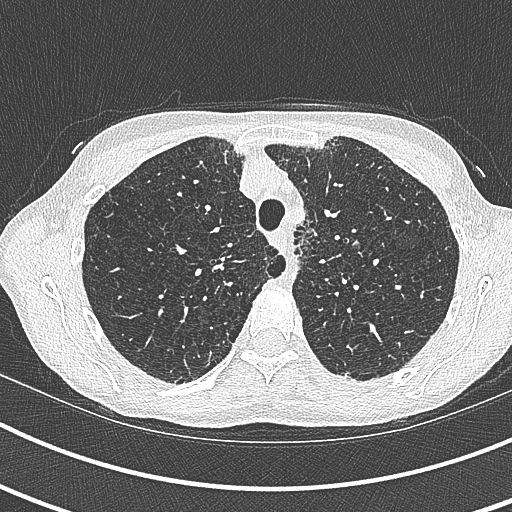
[im 298/358  lung]
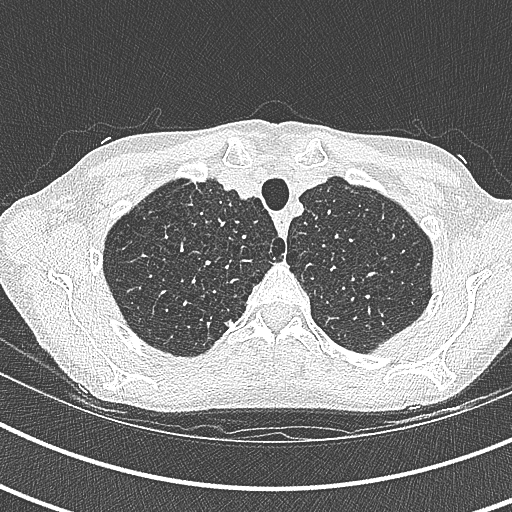
[im 328/358  lung]
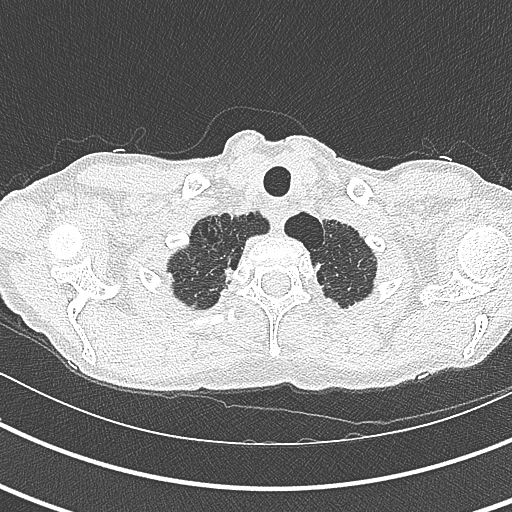

[Series 8: coronal · coronal · 0.62mm/px · 3 of 111 slices shown]
[im 23/111  lung]
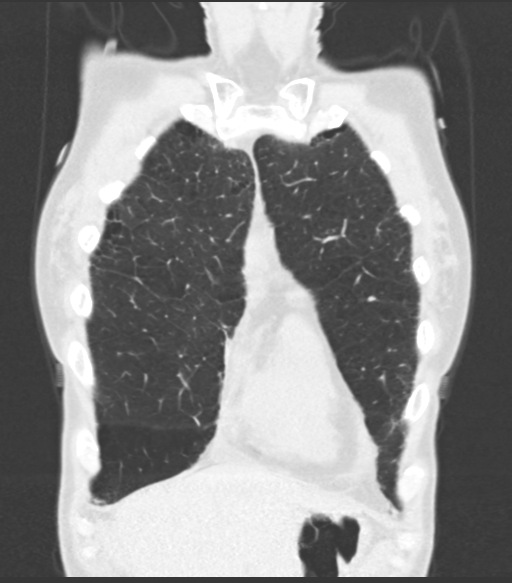
[im 45/111  lung]
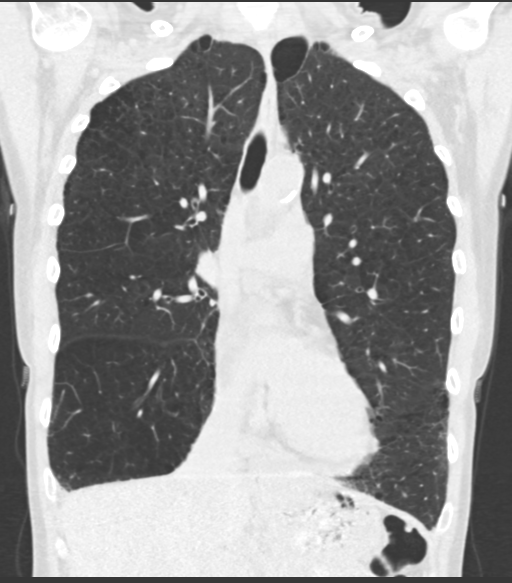
[im 67/111  lung]
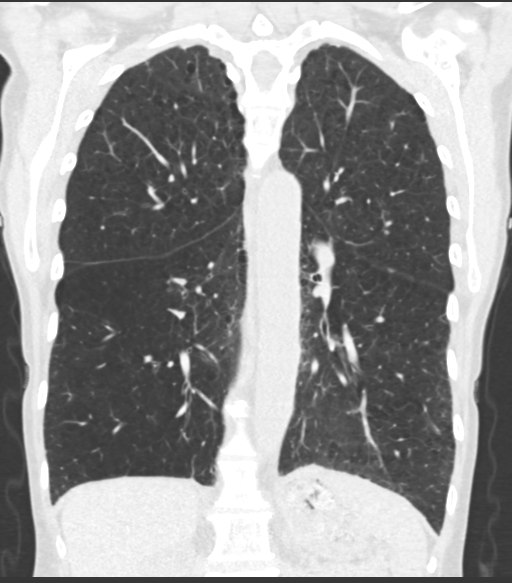

[14 of 36 positions shown; findings below may reference images not displayed]

FINDINGS: Cardiovascular: Aortic atherosclerosis. Normal heart size. Left and
right coronary artery calcifications. Unchanged small pericardial
effusion.

Mediastinum/Nodes: No enlarged mediastinal, hilar, or axillary lymph
nodes. Thyroid gland, trachea, and esophagus demonstrate no
significant findings.

Lungs/Pleura: Moderate centrilobular and paraseptal emphysema.
Diffuse bilateral bronchial wall thickening. Minimal, generally
bland appearing scarring and partial atelectasis at the lung bases.
No significant air trapping on expiratory phase imaging. Multiple
small bilateral pulmonary nodules are stable compared to prior
examination and definitively benign. No pleural effusion or
pneumothorax.

Upper Abdomen: No acute abnormality.

Musculoskeletal: No chest wall mass or suspicious bone lesions
identified.
IMPRESSION: 1. No evidence of fibrotic interstitial lung disease.
2. Moderate emphysema.
3. Diffuse bilateral bronchial wall thickening, consistent with
nonspecific infectious or inflammatory bronchitis.
4. Multiple small bilateral pulmonary nodules are stable compared to
prior examination and definitively benign. No further routine CT
follow-up is required for these nodules. Consider ongoing annual CT
lung cancer screening if indicated by patient age, smoking history,
and/or other risk factors for lung cancer.
5. Coronary artery disease.

Aortic Atherosclerosis (4LEG1-DE4.4) and Emphysema (4LEG1-TU8.Q).

## 2022-07-11 ENCOUNTER — Other Ambulatory Visit: Payer: Self-pay | Admitting: Obstetrics and Gynecology

## 2022-07-11 DIAGNOSIS — Z13820 Encounter for screening for osteoporosis: Secondary | ICD-10-CM | POA: Diagnosis not present

## 2022-07-11 DIAGNOSIS — Z681 Body mass index (BMI) 19 or less, adult: Secondary | ICD-10-CM | POA: Diagnosis not present

## 2022-07-11 DIAGNOSIS — Z01419 Encounter for gynecological examination (general) (routine) without abnormal findings: Secondary | ICD-10-CM | POA: Diagnosis not present

## 2022-07-11 DIAGNOSIS — N6489 Other specified disorders of breast: Secondary | ICD-10-CM

## 2022-07-12 ENCOUNTER — Other Ambulatory Visit: Payer: Self-pay | Admitting: Obstetrics and Gynecology

## 2022-07-12 DIAGNOSIS — F17200 Nicotine dependence, unspecified, uncomplicated: Secondary | ICD-10-CM

## 2022-07-12 DIAGNOSIS — Z800 Family history of malignant neoplasm of digestive organs: Secondary | ICD-10-CM

## 2022-08-19 ENCOUNTER — Encounter: Payer: Self-pay | Admitting: Cardiovascular Disease

## 2022-08-23 ENCOUNTER — Other Ambulatory Visit: Payer: BC Managed Care – PPO

## 2022-08-30 ENCOUNTER — Other Ambulatory Visit: Payer: BC Managed Care – PPO

## 2022-09-20 ENCOUNTER — Ambulatory Visit
Admission: RE | Admit: 2022-09-20 | Discharge: 2022-09-20 | Disposition: A | Discharge status: Home / Self Care | Payer: BC Managed Care – PPO | Source: Ambulatory Visit | Attending: Obstetrics and Gynecology | Admitting: Obstetrics and Gynecology

## 2022-09-20 ENCOUNTER — Ambulatory Visit: Payer: BC Managed Care – PPO

## 2022-09-20 DIAGNOSIS — N6489 Other specified disorders of breast: Secondary | ICD-10-CM | POA: Diagnosis not present

## 2022-11-01 DIAGNOSIS — I10 Essential (primary) hypertension: Secondary | ICD-10-CM | POA: Diagnosis not present

## 2022-11-01 DIAGNOSIS — J449 Chronic obstructive pulmonary disease, unspecified: Secondary | ICD-10-CM | POA: Diagnosis not present

## 2022-11-01 DIAGNOSIS — J410 Simple chronic bronchitis: Secondary | ICD-10-CM | POA: Diagnosis not present

## 2022-11-01 DIAGNOSIS — E7800 Pure hypercholesterolemia, unspecified: Secondary | ICD-10-CM | POA: Diagnosis not present

## 2022-11-01 DIAGNOSIS — I700 Atherosclerosis of aorta: Secondary | ICD-10-CM | POA: Diagnosis not present

## 2022-11-01 DIAGNOSIS — R634 Abnormal weight loss: Secondary | ICD-10-CM | POA: Diagnosis not present

## 2023-03-14 DIAGNOSIS — F411 Generalized anxiety disorder: Secondary | ICD-10-CM | POA: Diagnosis not present

## 2023-03-14 DIAGNOSIS — F324 Major depressive disorder, single episode, in partial remission: Secondary | ICD-10-CM | POA: Diagnosis not present

## 2023-06-06 DIAGNOSIS — F411 Generalized anxiety disorder: Secondary | ICD-10-CM | POA: Diagnosis not present

## 2023-06-06 DIAGNOSIS — F331 Major depressive disorder, recurrent, moderate: Secondary | ICD-10-CM | POA: Diagnosis not present

## 2023-06-17 DIAGNOSIS — F411 Generalized anxiety disorder: Secondary | ICD-10-CM | POA: Diagnosis not present

## 2023-06-17 DIAGNOSIS — F331 Major depressive disorder, recurrent, moderate: Secondary | ICD-10-CM | POA: Diagnosis not present

## 2023-07-01 DIAGNOSIS — F331 Major depressive disorder, recurrent, moderate: Secondary | ICD-10-CM | POA: Diagnosis not present

## 2023-07-01 DIAGNOSIS — F411 Generalized anxiety disorder: Secondary | ICD-10-CM | POA: Diagnosis not present

## 2023-07-11 DIAGNOSIS — F331 Major depressive disorder, recurrent, moderate: Secondary | ICD-10-CM | POA: Diagnosis not present

## 2023-07-11 DIAGNOSIS — F411 Generalized anxiety disorder: Secondary | ICD-10-CM | POA: Diagnosis not present

## 2023-07-18 DIAGNOSIS — F411 Generalized anxiety disorder: Secondary | ICD-10-CM | POA: Diagnosis not present

## 2023-07-18 DIAGNOSIS — F331 Major depressive disorder, recurrent, moderate: Secondary | ICD-10-CM | POA: Diagnosis not present

## 2023-08-08 DIAGNOSIS — F411 Generalized anxiety disorder: Secondary | ICD-10-CM | POA: Diagnosis not present

## 2023-08-08 DIAGNOSIS — F331 Major depressive disorder, recurrent, moderate: Secondary | ICD-10-CM | POA: Diagnosis not present

## 2023-08-29 ENCOUNTER — Encounter: Payer: Self-pay | Admitting: Internal Medicine

## 2023-09-12 DIAGNOSIS — F331 Major depressive disorder, recurrent, moderate: Secondary | ICD-10-CM | POA: Diagnosis not present

## 2023-09-12 DIAGNOSIS — F411 Generalized anxiety disorder: Secondary | ICD-10-CM | POA: Diagnosis not present

## 2023-09-19 ENCOUNTER — Other Ambulatory Visit: Payer: Self-pay | Admitting: Family Medicine

## 2023-09-19 DIAGNOSIS — G4700 Insomnia, unspecified: Secondary | ICD-10-CM | POA: Diagnosis not present

## 2023-09-19 DIAGNOSIS — E7800 Pure hypercholesterolemia, unspecified: Secondary | ICD-10-CM | POA: Diagnosis not present

## 2023-09-19 DIAGNOSIS — F324 Major depressive disorder, single episode, in partial remission: Secondary | ICD-10-CM | POA: Diagnosis not present

## 2023-09-19 DIAGNOSIS — Z122 Encounter for screening for malignant neoplasm of respiratory organs: Secondary | ICD-10-CM

## 2023-09-19 DIAGNOSIS — F17200 Nicotine dependence, unspecified, uncomplicated: Secondary | ICD-10-CM | POA: Diagnosis not present

## 2023-09-19 DIAGNOSIS — F419 Anxiety disorder, unspecified: Secondary | ICD-10-CM | POA: Diagnosis not present

## 2023-09-26 DIAGNOSIS — Z1211 Encounter for screening for malignant neoplasm of colon: Secondary | ICD-10-CM | POA: Diagnosis not present

## 2023-10-07 ENCOUNTER — Encounter: Payer: Self-pay | Admitting: Family Medicine

## 2023-10-17 ENCOUNTER — Other Ambulatory Visit

## 2023-10-20 ENCOUNTER — Telehealth: Payer: Self-pay | Admitting: Acute Care

## 2023-10-20 DIAGNOSIS — Z122 Encounter for screening for malignant neoplasm of respiratory organs: Secondary | ICD-10-CM

## 2023-10-20 DIAGNOSIS — Z87891 Personal history of nicotine dependence: Secondary | ICD-10-CM

## 2023-10-20 DIAGNOSIS — F17210 Nicotine dependence, cigarettes, uncomplicated: Secondary | ICD-10-CM

## 2023-10-20 NOTE — Telephone Encounter (Signed)
 Lung Cancer Screening Narrative/Criteria Questionnaire (Cigarette Smokers Only- No Cigars/Pipes/vapes)   Sherel Dikes   SDMV:11/28/2023 at 8:30am with Dara Ear NP        Dec 28, 1960   LDCT: 12/05/2023 at 9:40 at [facility]    63 y.o.   Phone: [phone]  Lung Screening Narrative (confirm age 70-77 yrs Medicare / 50-80 yrs Private pay insurance)   Insurance information:BCBS and Amerihealth   Referring Provider:Brooke Hester NP  This screening involves an initial phone call with a team member from our program. It is called a shared decision making visit. The initial meeting is required by  insurance and Medicare to make sure you understand the program. This appointment takes about 15-20 minutes to complete. You will complete the screening scan at your scheduled date/time.  This scan takes about 5-10 minutes to complete. You can eat and drink normally before and after the scan.  Criteria questions for Lung Cancer Screening:   Are you a current or former smoker? Current Age began smoking: 63yo   If you are a former smoker, what year did you quit smoking? N/A(within 15 yrs)   To calculate your smoking history, I need an accurate estimate of how many packs of cigarettes you smoked per day and for how many years. (Not just the number of PPD you are now smoking)   Years smoking 46 x Packs per day 1 = Pack years 46   (at least 20 pack yrs)   (Make sure they understand that we need to know how much they have smoked in the past, not just the number of PPD they are smoking now)  Do you have a personal history of cancer?  No    Do you have a family history of cancer? Yes  (cancer type and and relative) Brother - lung   Are you coughing up blood?  No  Have you had unexplained weight loss of 15 lbs or more in the last 6 months? No  It looks like you meet all criteria.  When would be a good time for us  to schedule you for this screening?   Additional information: N/A

## 2023-10-29 DIAGNOSIS — M0600 Rheumatoid arthritis without rheumatoid factor, unspecified site: Secondary | ICD-10-CM | POA: Diagnosis not present

## 2023-10-29 DIAGNOSIS — M25532 Pain in left wrist: Secondary | ICD-10-CM | POA: Diagnosis not present

## 2023-10-29 DIAGNOSIS — M545 Low back pain, unspecified: Secondary | ICD-10-CM | POA: Diagnosis not present

## 2023-11-20 DIAGNOSIS — Z01419 Encounter for gynecological examination (general) (routine) without abnormal findings: Secondary | ICD-10-CM | POA: Diagnosis not present

## 2023-11-20 DIAGNOSIS — Z1231 Encounter for screening mammogram for malignant neoplasm of breast: Secondary | ICD-10-CM | POA: Diagnosis not present

## 2023-11-20 DIAGNOSIS — Z1151 Encounter for screening for human papillomavirus (HPV): Secondary | ICD-10-CM | POA: Diagnosis not present

## 2023-11-20 DIAGNOSIS — Z124 Encounter for screening for malignant neoplasm of cervix: Secondary | ICD-10-CM | POA: Diagnosis not present

## 2023-11-20 DIAGNOSIS — Z681 Body mass index (BMI) 19 or less, adult: Secondary | ICD-10-CM | POA: Diagnosis not present

## 2023-11-21 ENCOUNTER — Ambulatory Visit (INDEPENDENT_AMBULATORY_CARE_PROVIDER_SITE_OTHER): Admitting: Acute Care

## 2023-11-21 ENCOUNTER — Encounter: Payer: Self-pay | Admitting: Acute Care

## 2023-11-21 ENCOUNTER — Other Ambulatory Visit: Payer: Self-pay | Admitting: Obstetrics and Gynecology

## 2023-11-21 DIAGNOSIS — Z800 Family history of malignant neoplasm of digestive organs: Secondary | ICD-10-CM

## 2023-11-21 DIAGNOSIS — F17210 Nicotine dependence, cigarettes, uncomplicated: Secondary | ICD-10-CM

## 2023-11-21 NOTE — Progress Notes (Signed)
  Virtual Visit via Telephone Note  I connected with [name] on 11/21/23 at  9:00 AM EDT by telephone and verified that I am speaking with the correct person using two identifiers.  Location: Patient:  At home Provider:  [address] [place], [place], [address]    I discussed the limitations, risks, security and privacy concerns of performing an evaluation and management service by telephone and the availability of in person appointments. I also discussed with the patient that there may be a patient responsible charge related to this service. The patient expressed understanding and agreed to proceed.   Shared Decision Making Visit Lung Cancer Screening Program [phone])   Eligibility: Age 63 y.o. Pack Years Smoking History Calculation 46 pack years  (# packs/per year x # years smoked) Recent History of coughing up blood  no Unexplained weight loss? no ( >Than 15 pounds within the last 6 months ) Prior History Lung / other cancer no (Diagnosis within the last 5 years already requiring surveillance chest CT Scans). Smoking Status Current Smoker Former Smokers: Years since quit:  NA  Quit Date:  NA  Visit Components: Discussion included one or more decision making aids. yes Discussion included risk/benefits of screening. yes Discussion included potential follow up diagnostic testing for abnormal scans. yes Discussion included meaning and risk of over diagnosis. yes Discussion included meaning and risk of False Positives. yes Discussion included meaning of total radiation exposure. yes  Counseling Included: Importance of adherence to annual lung cancer LDCT screening. yes Impact of comorbidities on ability to participate in the program. yes Ability and willingness to under diagnostic treatment. yes  Smoking Cessation Counseling: Current Smokers:  Discussed importance of smoking cessation. yes Information about tobacco cessation classes and interventions provided  to patient. yes Patient provided with "ticket" for LDCT Scan. yes Symptomatic Patient. no  Counseling NA Diagnosis Code: Tobacco Use Z72.0 Asymptomatic Patient yes  Counseling (Intermediate counseling: > three minutes counseling) [id number] Former Smokers:  Discussed the importance of maintaining cigarette abstinence. yes Diagnosis Code: Personal History of Nicotine Dependence. X91.478 Information about tobacco cessation classes and interventions provided to patient. Yes Patient provided with "ticket" for LDCT Scan. yes Written Order for Lung Cancer Screening with LDCT placed in Epic. Yes (CT Chest Lung Cancer Screening Low Dose W/O CM) [id number] Z12.2-Screening of respiratory organs Z87.891-Personal history of nicotine dependence   Raejean Bullock, NP

## 2023-11-21 NOTE — Patient Instructions (Signed)

## 2023-11-26 DIAGNOSIS — M79641 Pain in right hand: Secondary | ICD-10-CM | POA: Diagnosis not present

## 2023-11-26 DIAGNOSIS — M25551 Pain in right hip: Secondary | ICD-10-CM | POA: Diagnosis not present

## 2023-11-26 DIAGNOSIS — M1990 Unspecified osteoarthritis, unspecified site: Secondary | ICD-10-CM | POA: Diagnosis not present

## 2023-11-26 DIAGNOSIS — F17200 Nicotine dependence, unspecified, uncomplicated: Secondary | ICD-10-CM | POA: Diagnosis not present

## 2023-11-26 DIAGNOSIS — M79645 Pain in left finger(s): Secondary | ICD-10-CM | POA: Diagnosis not present

## 2023-11-26 DIAGNOSIS — M79642 Pain in left hand: Secondary | ICD-10-CM | POA: Diagnosis not present

## 2023-11-26 DIAGNOSIS — M25552 Pain in left hip: Secondary | ICD-10-CM | POA: Diagnosis not present

## 2023-11-26 DIAGNOSIS — M359 Systemic involvement of connective tissue, unspecified: Secondary | ICD-10-CM | POA: Diagnosis not present

## 2023-11-28 ENCOUNTER — Encounter: Admitting: Acute Care

## 2023-12-02 ENCOUNTER — Encounter

## 2023-12-05 ENCOUNTER — Ambulatory Visit
Admission: RE | Admit: 2023-12-05 | Discharge: 2023-12-05 | Disposition: A | Discharge status: Home / Self Care | Source: Ambulatory Visit | Attending: Acute Care | Admitting: Acute Care

## 2023-12-05 DIAGNOSIS — Z122 Encounter for screening for malignant neoplasm of respiratory organs: Secondary | ICD-10-CM | POA: Diagnosis not present

## 2023-12-05 DIAGNOSIS — Z87891 Personal history of nicotine dependence: Secondary | ICD-10-CM | POA: Diagnosis not present

## 2023-12-05 DIAGNOSIS — F17210 Nicotine dependence, cigarettes, uncomplicated: Secondary | ICD-10-CM

## 2023-12-05 DIAGNOSIS — R911 Solitary pulmonary nodule: Secondary | ICD-10-CM | POA: Diagnosis not present

## 2023-12-08 ENCOUNTER — Ambulatory Visit: Payer: Self-pay | Admitting: Internal Medicine

## 2023-12-08 NOTE — Telephone Encounter (Signed)
 Chief Complaint: SOB Symptoms: worsening exertional SOB Frequency: months Pertinent Negatives: Patient denies fever, URI sx, severe SOB, CP Disposition: [] ED /[] Urgent Care (no appt availability in office) / [x] Appointment(In office/virtual)/ []  [facility]/ [] Home Care/ [] Refused Recommended Disposition /[] [facility]/ []  Follow-up with PCP Additional Notes: Pt c/o worsening exertional SOB x months. Pt reports she has concern for sleep apnea and had recent CT Lungs. Pt reports taking medications as instructed and does not have rescue INH. Scheduled patient on soonest available OV appt on 03/02/2024. Patient verbalized understanding and to call back PRN.     Copied from CRM [id number]. Topic: Clinical - Red Word Triage >> Dec 08, 2023  8:00 AM Juliana Ocean wrote: Red Word that prompted transfer to Nurse Triage: SOB/ thinks she may need machine to help her breathe at night Reason for Disposition  [1] MILD longstanding difficulty breathing AND [2]  SAME as normal  Answer Assessment - Initial Assessment Questions E2C2 Pulmonary Triage - Initial Assessment Questions "Chief Complaint (e.g., cough, sob, wheezing, fever, chills, sweat or additional symptoms) *Go to specific symptom protocol after initial questions. SOB "I haven't been sleeping well for months" - concerns for sleep apnea r/t emphysema Pt reports CT Lungs this past Friday   "How long have symptoms been present?" months  Have you tested for COVID or Flu? Note: If not, ask patient if a home test can be taken. If so, instruct patient to call back for positive results. No  MEDICINES:   "Have you used any OTC meds to help with symptoms?" No If yes, ask "What medications?" N/a  "Have you used your inhalers/maintenance medication?" Yes If yes, "What medications?" Stiolto - 2 puffs in AM  If inhaler, ask "How many puffs and how often?" Note: Review instructions on medication in the chart. See  above  OXYGEN: "Do you wear supplemental oxygen?" No If yes, "How many liters are you supposed to use?" N/a  "Do you monitor your oxygen levels?" No If yes, "What is your reading (oxygen level) today?" N/a  "What is your usual oxygen saturation reading?"  (Note: Pulmonary O2 sats should be 90% or greater) N/a     4. SEVERITY: "How bad is your breathing?" (e.g., mild, moderate, severe)    - MILD: No SOB at rest, mild SOB with walking, speaks normally in sentences, can lie down, no retractions, pulse < 100.    - MODERATE: SOB at rest, SOB with minimal exertion and prefers to sit, cannot lie down flat, speaks in phrases, mild retractions, audible wheezing, pulse 100-120.    - SEVERE: Very SOB at rest, speaks in single words, struggling to breathe, sitting hunched forward, retractions, pulse > 120      Mild - SOB with exertion Triager does appreciate audible SOB/wheezing during call. Pt is speaking in full sentences.  5. RECURRENT SYMPTOM: "Have you had difficulty breathing before?" If Yes, ask: "When was the last time?" and "What happened that time?"      Yes, but "not as bad" 6. CARDIAC HISTORY: "Do you have any history of heart disease?" (e.g., heart attack, angina, bypass surgery, angioplasty)      denies 7. LUNG HISTORY: "Do you have any history of lung disease?"  (e.g., pulmonary embolus, asthma, emphysema)     Emphysema  8. CAUSE: "What do you think is causing the breathing problem?"      Unknown 9. OTHER SYMPTOMS: "Do you have any other symptoms? (e.g., dizziness, runny nose, cough, chest pain, fever)  Productive clear cough at baseline Denies other sx  Protocols used: Breathing Difficulty-A-AH

## 2023-12-08 NOTE — Telephone Encounter (Signed)
 FYI

## 2023-12-11 DIAGNOSIS — M79645 Pain in left finger(s): Secondary | ICD-10-CM | POA: Diagnosis not present

## 2023-12-11 DIAGNOSIS — M359 Systemic involvement of connective tissue, unspecified: Secondary | ICD-10-CM | POA: Diagnosis not present

## 2023-12-11 DIAGNOSIS — M1990 Unspecified osteoarthritis, unspecified site: Secondary | ICD-10-CM | POA: Diagnosis not present

## 2023-12-11 DIAGNOSIS — M542 Cervicalgia: Secondary | ICD-10-CM | POA: Diagnosis not present

## 2023-12-11 NOTE — Telephone Encounter (Signed)
 Pt already scheduled for first available with Tammy

## 2023-12-11 NOTE — Telephone Encounter (Signed)
 Been gong for months -If sleep is isse -> refer sleep doc

## 2023-12-12 ENCOUNTER — Ambulatory Visit: Payer: Self-pay | Admitting: Internal Medicine

## 2023-12-12 DIAGNOSIS — F411 Generalized anxiety disorder: Secondary | ICD-10-CM | POA: Diagnosis not present

## 2023-12-12 DIAGNOSIS — F331 Major depressive disorder, recurrent, moderate: Secondary | ICD-10-CM | POA: Diagnosis not present

## 2023-12-12 NOTE — Telephone Encounter (Signed)
 FYI Only or Action Required?: FYI only for provider  Patient is followed in Pulmonology for COPD, last seen on 11/21/2023 by Raejean Bullock, NP. Called Nurse Triage reporting Shortness of Breath. Symptoms began months ago. Interventions attempted: Other: N/A. Symptoms are: stable.  Triage Disposition: Call PCP When Office is Open  Patient/caregiver understands and will follow disposition?: No, wishes to speak with PCP           Copied from CRM [id number]. Topic: Appointments - Scheduling Inquiry for Clinic >> Dec 12, 2023  2:30 PM Alverda Joe S wrote: Reason for CRM: patient is calling to speak about an appointment she made with an nurse, she would like to speak to a nurse please. Reason for Disposition  [1] Caller requesting NON-URGENT health information AND [2] PCP's office is the best resource  Answer Assessment - Initial Assessment Questions 1. REASON FOR CALL or QUESTION: "What is your reason for calling today?" or "How can I best help you?" or "What question do you have that I can help answer?"     Patient called and wanted to try and get an appointment sooner than her scheduled appointment for 03/02/2024.  Patient spoke with a triage nurse 4 days ago and an appointment was set up for patient.  Patient was advised that the notes state she was scheduled for the earlier available appointment. Patient is understanding of this but still wants to speak with someone at the office about getting an earlier appointment.  Patient states that she is stable now and nothing has changed and she didn't want to be triaged again. She is advised however, that if anything worsens or changes to go to the Emergency Room. Patient verbalized understanding and stated that she would get taken care of if something got worse. She was just concerned with the wait until August she thinks she may end up needing another inhaler by then.  Protocols used: Information Only Call - No Triage-A-AH

## 2023-12-15 NOTE — Telephone Encounter (Signed)
 3 month followup for copd in August is fine. Outside of [facility] in May she was last seen a few years ago

## 2023-12-23 DIAGNOSIS — M545 Low back pain, unspecified: Secondary | ICD-10-CM | POA: Diagnosis not present

## 2023-12-23 DIAGNOSIS — M542 Cervicalgia: Secondary | ICD-10-CM | POA: Diagnosis not present

## 2023-12-24 ENCOUNTER — Encounter (HOSPITAL_COMMUNITY): Payer: Self-pay

## 2023-12-24 ENCOUNTER — Telehealth: Payer: Self-pay

## 2023-12-24 ENCOUNTER — Observation Stay (HOSPITAL_COMMUNITY)
Admission: EM | Admit: 2023-12-24 | Discharge: 2023-12-25 | Disposition: A | Discharge status: Home / Self Care | Source: Home / Self Care | Attending: Emergency Medicine | Admitting: Emergency Medicine

## 2023-12-24 ENCOUNTER — Emergency Department (HOSPITAL_COMMUNITY)

## 2023-12-24 ENCOUNTER — Other Ambulatory Visit: Payer: Self-pay

## 2023-12-24 DIAGNOSIS — Z720 Tobacco use: Secondary | ICD-10-CM | POA: Diagnosis not present

## 2023-12-24 DIAGNOSIS — E871 Hypo-osmolality and hyponatremia: Secondary | ICD-10-CM | POA: Insufficient documentation

## 2023-12-24 DIAGNOSIS — I7300 Raynaud's syndrome without gangrene: Secondary | ICD-10-CM | POA: Diagnosis not present

## 2023-12-24 DIAGNOSIS — F17200 Nicotine dependence, unspecified, uncomplicated: Secondary | ICD-10-CM | POA: Diagnosis not present

## 2023-12-24 DIAGNOSIS — R911 Solitary pulmonary nodule: Secondary | ICD-10-CM

## 2023-12-24 DIAGNOSIS — J439 Emphysema, unspecified: Secondary | ICD-10-CM | POA: Diagnosis not present

## 2023-12-24 DIAGNOSIS — Z87891 Personal history of nicotine dependence: Secondary | ICD-10-CM

## 2023-12-24 DIAGNOSIS — F17210 Nicotine dependence, cigarettes, uncomplicated: Secondary | ICD-10-CM

## 2023-12-24 DIAGNOSIS — R0602 Shortness of breath: Secondary | ICD-10-CM | POA: Diagnosis present

## 2023-12-24 DIAGNOSIS — Z7982 Long term (current) use of aspirin: Secondary | ICD-10-CM | POA: Diagnosis not present

## 2023-12-24 DIAGNOSIS — Z122 Encounter for screening for malignant neoplasm of respiratory organs: Secondary | ICD-10-CM

## 2023-12-24 DIAGNOSIS — J441 Chronic obstructive pulmonary disease with (acute) exacerbation: Secondary | ICD-10-CM | POA: Diagnosis not present

## 2023-12-24 DIAGNOSIS — D72829 Elevated white blood cell count, unspecified: Secondary | ICD-10-CM | POA: Insufficient documentation

## 2023-12-24 DIAGNOSIS — J9601 Acute respiratory failure with hypoxia: Principal | ICD-10-CM | POA: Insufficient documentation

## 2023-12-24 LAB — CBC WITH DIFFERENTIAL/PLATELET
Abs Immature Granulocytes: 0.05 10*3/uL (ref 0.00–0.07)
Basophils Absolute: 0.1 10*3/uL (ref 0.0–0.1)
Basophils Relative: 0 %
Eosinophils Absolute: 0.4 10*3/uL (ref 0.0–0.5)
Eosinophils Relative: 3 %
HCT: 44.8 % (ref 36.0–46.0)
Hemoglobin: 14.8 g/dL (ref 12.0–15.0)
Immature Granulocytes: 0 %
Lymphocytes Relative: 7 %
Lymphs Abs: 1 10*3/uL (ref 0.7–4.0)
MCH: 32 pg (ref 26.0–34.0)
MCHC: 33 g/dL (ref 30.0–36.0)
MCV: 96.8 fL (ref 80.0–100.0)
Monocytes Absolute: 1.1 10*3/uL — ABNORMAL HIGH (ref 0.1–1.0)
Monocytes Relative: 7 %
Neutro Abs: 11.9 10*3/uL — ABNORMAL HIGH (ref 1.7–7.7)
Neutrophils Relative %: 83 %
Platelets: 203 10*3/uL (ref 150–400)
RBC: 4.63 MIL/uL (ref 3.87–5.11)
RDW: 12.9 % (ref 11.5–15.5)
WBC: 14.5 10*3/uL — ABNORMAL HIGH (ref 4.0–10.5)
nRBC: 0 % (ref 0.0–0.2)

## 2023-12-24 LAB — BASIC METABOLIC PANEL WITH GFR
Anion gap: 9 (ref 5–15)
BUN: 13 mg/dL (ref 8–23)
CO2: 25 mmol/L (ref 22–32)
Calcium: 9.2 mg/dL (ref 8.9–10.3)
Chloride: 99 mmol/L (ref 98–111)
Creatinine, Ser: 0.97 mg/dL (ref 0.44–1.00)
GFR, Estimated: >60 mL/min (ref >60)
Glucose, Bld: 92 mg/dL (ref 70–99)
Potassium: 4.4 mmol/L (ref 3.5–5.1)
Sodium: 133 mmol/L — ABNORMAL LOW (ref 135–145)

## 2023-12-24 LAB — RESP PANEL BY RT-PCR (RSV, FLU A&B, COVID)  RVPGX2
Influenza A by PCR: NEGATIVE
Influenza B by PCR: NEGATIVE
Resp Syncytial Virus by PCR: NEGATIVE
SARS Coronavirus 2 by RT PCR: NEGATIVE

## 2023-12-24 LAB — I-STAT VENOUS BLOOD GAS, ED
Acid-Base Excess: 1 mmol/L (ref 0.0–2.0)
Bicarbonate: 28.4 mmol/L — ABNORMAL HIGH (ref 20.0–28.0)
Calcium, Ion: 1.16 mmol/L (ref 1.15–1.40)
HCT: 46 % (ref 36.0–46.0)
Hemoglobin: 15.6 g/dL — ABNORMAL HIGH (ref 12.0–15.0)
O2 Saturation: 56 %
Potassium: 4.5 mmol/L (ref 3.5–5.1)
Sodium: 134 mmol/L — ABNORMAL LOW (ref 135–145)
TCO2: 30 mmol/L (ref 22–32)
pCO2, Ven: 53.1 mmHg (ref 44–60)
pH, Ven: 7.336 (ref 7.25–7.43)
pO2, Ven: 32 mmHg (ref 32–45)

## 2023-12-24 MED ORDER — METHYLPREDNISOLONE SODIUM SUCC 125 MG IJ SOLR
125.0000 mg | Freq: Once | INTRAMUSCULAR | Status: AC
  Administered 2023-12-24: 125 mg via INTRAVENOUS
  Filled 2023-12-24: qty 2

## 2023-12-24 MED ORDER — IPRATROPIUM-ALBUTEROL 0.5-2.5 (3) MG/3ML IN SOLN
RESPIRATORY_TRACT | Status: AC
  Filled 2023-12-24: qty 3

## 2023-12-24 MED ORDER — IPRATROPIUM-ALBUTEROL 0.5-2.5 (3) MG/3ML IN SOLN
RESPIRATORY_TRACT | Status: AC
  Administered 2023-12-24: 3 mL
  Filled 2023-12-24: qty 3

## 2023-12-24 MED ORDER — MAGNESIUM SULFATE 2 GM/50ML IV SOLN
2.0000 g | Freq: Once | INTRAVENOUS | Status: AC
  Administered 2023-12-24: 2 g via INTRAVENOUS
  Filled 2023-12-24: qty 50

## 2023-12-24 NOTE — ED Notes (Signed)
 Pt asked to use bathroom. This RN brought a bedside commode to pt's room. Pt was then able to stand on her own to pivot on and off the commode. After getting back in bed, pt was noted to have increased work of breathing. Pt voiced feeling okay but like she was having to catch her breath again. Pt O2 then dropped to 94% and stayed around there for a minute. Pt O2 sat then slowly increased back to 99% on the 2L [place].

## 2023-12-24 NOTE — ED Provider Notes (Signed)
 [place] [facility] AT [facility] Provider Note   CSN: [id number] Arrival date & time: 12/24/23  1858     Patient presents with: Shortness of Breath   [name] is a 63 y.o. female.  She has a history of emphysema and smokes.  Not normally on oxygen.  She works in a hot environment and said she became very short of breath this afternoon.  No chest pain or cough, no fever.  No hemoptysis.  No vomiting or diarrhea.  {Add pertinent medical, surgical, social history, OB history to WUJ:81191} The history is provided by the patient.  Shortness of Breath Severity:  Severe Onset quality:  Gradual Duration:  4 hours Timing:  Constant Progression:  Unchanged Chronicity:  New Relieved by:  Nothing Worsened by:  Activity Ineffective treatments:  Rest Associated symptoms: wheezing   Associated symptoms: no abdominal pain, no chest pain, no cough, no fever, no hemoptysis and no sputum production   Risk factors: tobacco use        Prior to Admission medications   Medication Sig Start Date End Date Taking? Authorizing Provider  albuterol  (VENTOLIN  HFA) 108 (90 Base) MCG/ACT inhaler Inhale 2 puffs into the lungs every 4 (four) hours as needed for wheezing or shortness of breath. 01/07/20   Maire Scot, MD  amLODipine  (NORVASC ) 5 MG tablet TAKE 1 TABLET BY MOUTH EVERY DAY 01/17/21   Arida, Muhammad A, MD  ARIPiprazole (ABILIFY) 5 MG tablet Take 5 mg by mouth daily. 04/13/21   [provider]  aspirin  EC 81 MG tablet Take 1 tablet (81 mg total) by mouth daily. 08/06/16   Wenona Hamilton, MD  atorvastatin  (LIPITOR) 20 MG tablet Take 1 tablet (20 mg total) by mouth daily. 12/07/19   Wenona Hamilton, MD  DULoxetine (CYMBALTA) 60 MG capsule 1 capsule    [provider]  ibuprofen (ADVIL,MOTRIN) 200 MG tablet Take 600 mg by mouth every 6 (six) hours as needed. pain    [provider]  LORazepam (ATIVAN) 0.5 MG tablet 1 2 TABLETS TWICE A DAY  AS NEEDED FOR ANXIETY ORALLY 30 DAYS 11/18/18   [provider]  Multiple Vitamin (MULTIVITAMIN WITH MINERALS) TABS Take 1 tablet by mouth daily.    [provider]  STIOLTO RESPIMAT  2.5-2.5 MCG/ACT AERS INHALE 2 PUFFS BY MOUTH INTO THE LUNGS DAILY 04/25/20   Maire Scot, MD  varenicline  (CHANTIX  PAK) 0.5 MG X 11 & 1 MG X 42 tablet Take one 0.5 mg tablet by mouth once daily for 3 days, then increase to one 0.5 mg tablet twice daily for 4 days, then increase to one 1 mg tablet twice daily. 07/27/21   Maire Scot, MD    Allergies: Patient has no known allergies.    Review of Systems  Constitutional:  Negative for fever.  Respiratory:  Positive for shortness of breath and wheezing. Negative for cough, hemoptysis and sputum production.   Cardiovascular:  Negative for chest pain.  Gastrointestinal:  Negative for abdominal pain.    Updated Vital Signs BP 118/66 (BP Location: Right Arm)   Pulse 89   Temp 98.2 F (36.8 C)   Resp 16   SpO2 92%   Physical Exam Vitals and nursing note reviewed.  Constitutional:      General: She is not in acute distress.    Appearance: Normal appearance. She is well-developed.  HENT:     Head: Normocephalic and atraumatic.   Eyes:     Conjunctiva/sclera: Conjunctivae  normal.    Cardiovascular:     Rate and Rhythm: Normal rate and regular rhythm.     Heart sounds: No murmur heard. Pulmonary:     Effort: Tachypnea and accessory muscle usage present. No respiratory distress.     Breath sounds: Normal breath sounds. No stridor. No wheezing.  Abdominal:     Palpations: Abdomen is soft.     Tenderness: There is no abdominal tenderness. There is no guarding or rebound.   Musculoskeletal:        General: No tenderness or deformity. Normal range of motion.     Cervical back: Neck supple.     Right lower leg: No tenderness. No edema.     Left lower leg: No tenderness. No edema.   Skin:    General: Skin is warm and dry.    Neurological:     General: No focal deficit present.     Mental Status: She is alert.     GCS: GCS eye subscore is 4. GCS verbal subscore is 5. GCS motor subscore is 6.     (all labs ordered are listed, but only abnormal results are displayed) Labs Reviewed  RESP PANEL BY RT-PCR (RSV, FLU A&B, COVID)  [id number]  BASIC METABOLIC PANEL WITH GFR  CBC WITH DIFFERENTIAL/PLATELET  I-STAT VENOUS BLOOD GAS, ED    EKG: None  Radiology: No results found.  {Document cardiac monitor, telemetry assessment procedure when appropriate:32947} Procedures   Medications Ordered in the ED  methylPREDNISolone sodium succinate (SOLU-MEDROL) 125 mg/2 mL injection 125 mg (has no administration in time range)  magnesium sulfate IVPB 2 g 50 mL (has no administration in time range)  ipratropium-albuterol  (DUONEB) 0.5-2.5 (3) MG/3ML nebulizer solution (has no administration in time range)  ipratropium-albuterol  (DUONEB) 0.5-2.5 (3) MG/3ML nebulizer solution (3 mLs  Given 12/24/23 1926)      {Click here for ABCD2, HEART and other calculators REFRESH Note before signing:1}                              Medical Decision Making Amount and/or Complexity of Data Reviewed Labs: ordered. Radiology: ordered.  Risk Prescription drug management.   This patient complains of ***; this involves an extensive number of treatment Options and is a complaint that carries with it a high risk of complications and morbidity. The differential includes ***  I ordered, reviewed and interpreted labs, which included *** I ordered medication *** and reviewed PMP when indicated. I ordered imaging studies which included *** and I independently    visualized and interpreted imaging which showed *** Additional history obtained from *** Previous records obtained and reviewed *** I consulted *** and discussed lab and imaging findings and discussed disposition.  Cardiac monitoring reviewed, *** Social determinants considered,  *** Critical Interventions: ***  After the interventions stated above, I reevaluated the patient and found *** Admission and further testing considered, ***   {Document critical care time when appropriate  Document review of labs and clinical decision tools ie CHADS2VASC2, etc  Document your independent review of radiology images and any outside records  Document your discussion with family members, caretakers and with consultants  Document social determinants of health affecting pt's care  Document your decision making why or why not admission, treatments were needed:32947:::1}   Final diagnoses:  None    ED Discharge Orders     None

## 2023-12-24 NOTE — H&P (Signed)
 History and Physical    MARTIN SMEAL ZOX:[id number] DOB: [birth year] DOA: 12/24/2023  Patient coming from: Home.  Chief Complaint: Shortness of breath.  HPI: [name] is a 63 y.o. female with history of COPD with ongoing tobacco abuse, Raynaud's disease presents to the ER with complaints of shortness of breath.  Patient states she has been having shortness of breath for many weeks now but over the last 24 hours it got worse.  Denies any chest pain fever or chills.  Has been having productive cough with whitish colored sputum.  ED Course: In the ER patient is found to be diffusely wheezing.  Chest x-ray was unremarkable.  Labs show mild hyponatremia.  COVID and flu test were negative.  Venous blood gas shows pH of 7.33.  Patient started on nebulizer steroid admitted for COPD exacerbation.  Review of Systems: As per HPI, rest all negative.   Past Medical History:  Diagnosis Date   Raynaud disease    Tobacco use     Past Surgical History:  Procedure Laterality Date   CESAREAN SECTION     IR RADIOLOGIST EVAL & MGMT  09/10/2016     reports that she has been smoking cigarettes. She has a 60 pack-year smoking history. She has never used smokeless tobacco. She reports current alcohol use. She reports current drug use. Drug: Marijuana.  No Known Allergies  Family History  Problem Relation Age of Onset   Pancreatic cancer Brother    Breast cancer Niece 19    Prior to Admission medications   Medication Sig Start Date End Date Taking? Authorizing Provider  albuterol  (VENTOLIN  HFA) 108 (90 Base) MCG/ACT inhaler Inhale 2 puffs into the lungs every 4 (four) hours as needed for wheezing or shortness of breath. 01/07/20   Maire Scot, MD  amLODipine  (NORVASC ) 5 MG tablet TAKE 1 TABLET BY MOUTH EVERY DAY 01/17/21   Arida, Muhammad A, MD  ARIPiprazole (ABILIFY) 5 MG tablet Take 5 mg by mouth daily. 04/13/21   [provider]  aspirin  EC 81 MG tablet Take 1 tablet (81 mg  total) by mouth daily. 08/06/16   Wenona Hamilton, MD  atorvastatin  (LIPITOR) 20 MG tablet Take 1 tablet (20 mg total) by mouth daily. 12/07/19   Wenona Hamilton, MD  DULoxetine (CYMBALTA) 60 MG capsule 1 capsule    [provider]  ibuprofen (ADVIL,MOTRIN) 200 MG tablet Take 600 mg by mouth every 6 (six) hours as needed. pain    [provider]  LORazepam (ATIVAN) 0.5 MG tablet 1 2 TABLETS TWICE A DAY AS NEEDED FOR ANXIETY ORALLY 30 DAYS 11/18/18   [provider]  Multiple Vitamin (MULTIVITAMIN WITH MINERALS) TABS Take 1 tablet by mouth daily.    [provider]  STIOLTO RESPIMAT  2.5-2.5 MCG/ACT AERS INHALE 2 PUFFS BY MOUTH INTO THE LUNGS DAILY 04/25/20   Maire Scot, MD  varenicline  (CHANTIX  PAK) 0.5 MG X 11 & 1 MG X 42 tablet Take one 0.5 mg tablet by mouth once daily for 3 days, then increase to one 0.5 mg tablet twice daily for 4 days, then increase to one 1 mg tablet twice daily. 07/27/21   Maire Scot, MD    Physical Exam: Constitutional: Moderately built and nourished. Vitals:   12/24/23 2209 12/24/23 2210 12/24/23 2211 12/24/23 2212  BP:      Pulse: 86 82 84 85  Resp: (!) 28 (!) 26 17 12   Temp:      SpO2: (!) 89% 92%  93% 93%   Eyes: Anicteric no pallor. ENMT: No discharge from the ears eyes nose and mouth. Neck: No mass felt.  No neck rigidity. Respiratory: Bilateral expiratory wheezes are no crepitations. Cardiovascular: S1-S2 heard. Abdomen: Soft nontender bowel sound present. Musculoskeletal: No edema. Skin: No rash. Neurologic: Alert awake oriented to time place and person.  Moves all extremities. Psychiatric: Appears normal.  Normal affect.   Labs on Admission: I have personally reviewed following labs and imaging studies  CBC: Recent Labs  Lab 12/24/23 1935 12/24/23 1944  WBC 14.5*  --   NEUTROABS 11.9*  --   HGB 14.8 15.6*  HCT 44.8 46.0  MCV 96.8  --   PLT 203  --    Basic Metabolic Panel: Recent Labs   Lab 12/24/23 1935 12/24/23 1944  NA 133* 134*  K 4.4 4.5  CL 99  --   CO2 25  --   GLUCOSE 92  --   BUN 13  --   CREATININE 0.97  --   CALCIUM  9.2  --    GFR: CrCl cannot be calculated (Unknown ideal weight.). Liver Function Tests: No results for input(s): AST, ALT, ALKPHOS, BILITOT, PROT, ALBUMIN in the last 168 hours. No results for input(s): LIPASE, AMYLASE in the last 168 hours. No results for input(s): AMMONIA in the last 168 hours. Coagulation Profile: No results for input(s): INR, PROTIME in the last 168 hours. Cardiac Enzymes: No results for input(s): CKTOTAL, CKMB, CKMBINDEX, TROPONINI in the last 168 hours. BNP (last 3 results) No results for input(s): PROBNP in the last 8760 hours. HbA1C: No results for input(s): HGBA1C in the last 72 hours. CBG: No results for input(s): GLUCAP in the last 168 hours. Lipid Profile: No results for input(s): CHOL, HDL, LDLCALC, TRIG, CHOLHDL, LDLDIRECT in the last 72 hours. Thyroid Function Tests: No results for input(s): TSH, T4TOTAL, FREET4, T3FREE, THYROIDAB in the last 72 hours. Anemia Panel: No results for input(s): VITAMINB12, FOLATE, FERRITIN, TIBC, IRON, RETICCTPCT in the last 72 hours. Urine analysis:    Component Value Date/Time   COLORURINE YELLOW 03/05/2012 2123   APPEARANCEUR CLEAR 03/05/2012 2123   LABSPEC 1.018 03/05/2012 2123   PHURINE 6.5 03/05/2012 2123   GLUCOSEU NEGATIVE 03/05/2012 2123   HGBUR NEGATIVE 03/05/2012 2123   BILIRUBINUR NEGATIVE 03/05/2012 2123   KETONESUR NEGATIVE 03/05/2012 2123   PROTEINUR NEGATIVE 03/05/2012 2123   UROBILINOGEN 0.2 03/05/2012 2123   NITRITE NEGATIVE 03/05/2012 2123   LEUKOCYTESUR NEGATIVE 03/05/2012 2123   Sepsis Labs: @LABRCNTIP (procalcitonin:4,lacticidven:4) ) Recent Results (from the past 240 hours)  Resp panel by RT-PCR (RSV, Flu A&B, Covid) Anterior Nasal Swab     Status: None   Collection  Time: 12/24/23  8:10 PM   Specimen: Anterior Nasal Swab  Result Value Ref Range Status   SARS Coronavirus 2 by RT PCR NEGATIVE NEGATIVE Final   Influenza A by PCR NEGATIVE NEGATIVE Final   Influenza B by PCR NEGATIVE NEGATIVE Final    Comment: (NOTE) The Xpert Xpress SARS-CoV-2/FLU/RSV plus assay is intended as an aid in the diagnosis of influenza from Nasopharyngeal swab specimens and should not be used as a sole basis for treatment. Nasal washings and aspirates are unacceptable for Xpert Xpress SARS-CoV-2/FLU/RSV testing.  Fact Sheet for Patients: BloggerCourse.com  Fact Sheet for Healthcare Providers: SeriousBroker.it  This test is not yet approved or cleared by the [place]  FDA and has been authorized for detection and/or diagnosis of SARS-CoV-2 by FDA under an Emergency Use Authorization (EUA). This EUA  will remain in effect (meaning this test can be used) for the duration of the COVID-19 declaration under Section 564(b)(1) of the Act, 21 U.S.C. section 360bbb-3(b)(1), unless the authorization is terminated or revoked.     Resp Syncytial Virus by PCR NEGATIVE NEGATIVE Final    Comment: (NOTE) Fact Sheet for Patients: BloggerCourse.com  Fact Sheet for Healthcare Providers: SeriousBroker.it  This test is not yet approved or cleared by the [place]  FDA and has been authorized for detection and/or diagnosis of SARS-CoV-2 by FDA under an Emergency Use Authorization (EUA). This EUA will remain in effect (meaning this test can be used) for the duration of the COVID-19 declaration under Section 564(b)(1) of the Act, 21 U.S.C. section 360bbb-3(b)(1), unless the authorization is terminated or revoked.  Performed at [facility], [address] [place], [place] [address]      Radiological Exams on Admission: DG Chest Port 1 View Result Date:  12/24/2023 CLINICAL DATA:  Shortness of breath EXAM: PORTABLE CHEST 1 VIEW COMPARISON:  CT 12/05/2023, radiograph 06/14/2018 FINDINGS: Hyperinflation with emphysema. No acute airspace disease or effusion. Stable cardiomediastinal silhouette. No pneumothorax IMPRESSION: No active disease. Hyperinflation with emphysema. Electronically Signed   By: Esmeralda Hedge M.D.   On: 12/24/2023 19:50    EKG: Independently reviewed.  Normal sinus rhythm.  Assessment/Plan Principal Problem:   COPD exacerbation (HCC)    COPD exacerbation -   presently on 2 L oxygen.  Will keep patient on scheduled and as needed DuoNebs.  Pulmicort.  IV Solu-Medrol.  Antibiotics. History of Raynaud's disease used to be on amlodipine  but patient stopped taking it after blood pressure was getting low. Tobacco abuse advised about quitting. Mild hyponatremia follow metabolic panel. Lung nodule seen on the recent CAT scan done for lung cancer screening.  To be followed.  Since patient has COPD exacerbation will need close monitoring further workup and more than 2 midnight stay.   DVT prophylaxis: Lovenox. Code Status: Full code. Family Communication: Discussed with patient. Disposition Plan: Medical floor. Consults called: None. Admission status: Observation.

## 2023-12-24 NOTE — ED Triage Notes (Signed)
 Pt brought into the ED by her neighbor, pt reports shortness of breath onset today around 1845. She is tachypneic, struggling to breathe, unable to talk. Initial RA sats 83%, but was not 100% certain due to poor wavelength in triage. Pt placed on 15l NRB and taken to a treatment room. Denies pain. She reports the NRB has helped with her breathing. Hx of Emphysema.

## 2023-12-24 NOTE — Telephone Encounter (Signed)
 Called and spoke with patient. Reviewed results below. She is in agreement to complete a 6 month follow up scan around 06/01/2024, she is aware we will call her closer to due date. Results and plan to PCP. Order placed. No additional concerns.   IMPRESSION: 1. Lung-RADS 3, probably benign findings. 4.1 mm nodule in the posterior right upper lobe is new in the interval.Short-term follow-up in 6 months is recommended with repeat low-dose chest CT without contrast (please use the following order, CT CHEST LCS NODULE FOLLOW-UP W/O CM). 2. Aortic Atherosclerosis (ICD10-I70.0) and Emphysema (ICD10-J43.9).

## 2023-12-24 NOTE — ED Notes (Signed)
 Pt ambulatory O2 sat with no oxygen 86%. Once pt was in the bed, O2 sat raised to 88% for several minutes. Pt now 93% in bed.

## 2023-12-24 NOTE — ED Notes (Signed)
 Pt taken off 2L  for a room air O2 sat

## 2023-12-24 NOTE — ED Notes (Signed)
 Patient transferred upstairs by this NT.

## 2023-12-25 ENCOUNTER — Encounter (HOSPITAL_COMMUNITY): Payer: Self-pay | Admitting: Internal Medicine

## 2023-12-25 ENCOUNTER — Other Ambulatory Visit: Payer: Self-pay

## 2023-12-25 ENCOUNTER — Other Ambulatory Visit (HOSPITAL_COMMUNITY): Payer: Self-pay

## 2023-12-25 DIAGNOSIS — Z720 Tobacco use: Secondary | ICD-10-CM | POA: Diagnosis not present

## 2023-12-25 DIAGNOSIS — E871 Hypo-osmolality and hyponatremia: Secondary | ICD-10-CM | POA: Diagnosis not present

## 2023-12-25 DIAGNOSIS — I7300 Raynaud's syndrome without gangrene: Secondary | ICD-10-CM

## 2023-12-25 DIAGNOSIS — J441 Chronic obstructive pulmonary disease with (acute) exacerbation: Secondary | ICD-10-CM | POA: Diagnosis not present

## 2023-12-25 LAB — BASIC METABOLIC PANEL WITH GFR
Anion gap: 10 (ref 5–15)
BUN: 12 mg/dL (ref 8–23)
CO2: 24 mmol/L (ref 22–32)
Calcium: 9 mg/dL (ref 8.9–10.3)
Chloride: 103 mmol/L (ref 98–111)
Creatinine, Ser: 0.84 mg/dL (ref 0.44–1.00)
GFR, Estimated: >60 mL/min (ref >60)
Glucose, Bld: 146 mg/dL — ABNORMAL HIGH (ref 70–99)
Potassium: 4.5 mmol/L (ref 3.5–5.1)
Sodium: 137 mmol/L (ref 135–145)

## 2023-12-25 LAB — CBC
HCT: 43.3 % (ref 36.0–46.0)
Hemoglobin: 14.5 g/dL (ref 12.0–15.0)
MCH: 31.9 pg (ref 26.0–34.0)
MCHC: 33.5 g/dL (ref 30.0–36.0)
MCV: 95.4 fL (ref 80.0–100.0)
Platelets: 195 10*3/uL (ref 150–400)
RBC: 4.54 MIL/uL (ref 3.87–5.11)
RDW: 12.7 % (ref 11.5–15.5)
WBC: 9.8 10*3/uL (ref 4.0–10.5)
nRBC: 0 % (ref 0.0–0.2)

## 2023-12-25 LAB — HIV ANTIBODY (ROUTINE TESTING W REFLEX): HIV Screen 4th Generation wRfx: NONREACTIVE

## 2023-12-25 MED ORDER — ALBUTEROL SULFATE HFA 108 (90 BASE) MCG/ACT IN AERS
2.0000 | INHALATION_SPRAY | Freq: Four times a day (QID) | RESPIRATORY_TRACT | 2 refills | Status: DC | PRN
  Filled 2023-12-25: qty 6.7, 30d supply, fill #0

## 2023-12-25 MED ORDER — BUDESONIDE 0.25 MG/2ML IN SUSP
0.2500 mg | Freq: Two times a day (BID) | RESPIRATORY_TRACT | Status: DC
  Filled 2023-12-25: qty 2

## 2023-12-25 MED ORDER — ENOXAPARIN SODIUM 40 MG/0.4ML IJ SOSY
40.0000 mg | PREFILLED_SYRINGE | INTRAMUSCULAR | Status: DC
  Filled 2023-12-25: qty 0.4

## 2023-12-25 MED ORDER — ACETAMINOPHEN 325 MG PO TABS
650.0000 mg | ORAL_TABLET | Freq: Once | ORAL | Status: AC
  Filled 2023-12-25: qty 2

## 2023-12-25 MED ORDER — AZITHROMYCIN 500 MG PO TABS
500.0000 mg | ORAL_TABLET | Freq: Every day | ORAL | 0 refills | Status: AC
  Filled 2023-12-25: qty 2, 2d supply, fill #0

## 2023-12-25 MED ORDER — COMPRESSOR/NEBULIZER MISC
1.0000 | Freq: Four times a day (QID) | 0 refills | Status: AC | PRN
  Filled 2023-12-25: qty 1, fill #0

## 2023-12-25 MED ORDER — ACETAMINOPHEN 325 MG PO TABS
650.0000 mg | ORAL_TABLET | Freq: Four times a day (QID) | ORAL | Status: DC | PRN
Start: 2023-12-25 — End: 2023-12-25

## 2023-12-25 MED ORDER — METHYLPREDNISOLONE SODIUM SUCC 40 MG IJ SOLR
40.0000 mg | Freq: Two times a day (BID) | INTRAMUSCULAR | Status: DC
  Filled 2023-12-25: qty 1

## 2023-12-25 MED ORDER — ACETAMINOPHEN 650 MG RE SUPP: 650.0000 mg | Freq: Four times a day (QID) | RECTAL | Status: DC | PRN

## 2023-12-25 MED ORDER — PREDNISONE 10 MG PO TABS
ORAL_TABLET | ORAL | 0 refills | Status: AC
  Filled 2023-12-25: qty 20, 8d supply, fill #0

## 2023-12-25 MED ORDER — IPRATROPIUM-ALBUTEROL 0.5-2.5 (3) MG/3ML IN SOLN
3.0000 mL | RESPIRATORY_TRACT | Status: DC
  Filled 2023-12-25: qty 3

## 2023-12-25 MED ORDER — AZITHROMYCIN 500 MG PO TABS: 500.0000 mg | ORAL_TABLET | Freq: Every day | ORAL | Status: DC

## 2023-12-25 MED ORDER — IPRATROPIUM-ALBUTEROL 0.5-2.5 (3) MG/3ML IN SOLN
3.0000 mL | Freq: Four times a day (QID) | RESPIRATORY_TRACT | 1 refills | Status: DC | PRN
  Filled 2023-12-25: qty 360, 30d supply, fill #0

## 2023-12-25 MED ORDER — IPRATROPIUM-ALBUTEROL 0.5-2.5 (3) MG/3ML IN SOLN: 3.0000 mL | Freq: Four times a day (QID) | RESPIRATORY_TRACT | Status: DC | PRN

## 2023-12-25 MED ORDER — IPRATROPIUM-ALBUTEROL 0.5-2.5 (3) MG/3ML IN SOLN
3.0000 mL | Freq: Three times a day (TID) | RESPIRATORY_TRACT | Status: DC
  Filled 2023-12-25: qty 3

## 2023-12-25 MED ORDER — IPRATROPIUM-ALBUTEROL 0.5-2.5 (3) MG/3ML IN SOLN: 3.0000 mL | RESPIRATORY_TRACT | Status: DC | PRN

## 2023-12-25 MED ORDER — SODIUM CHLORIDE 0.9 % IV SOLN
500.0000 mg | INTRAVENOUS | Status: AC
  Filled 2023-12-25: qty 5

## 2023-12-25 NOTE — Evaluation (Signed)
 Occupational Therapy Evaluation Patient Details Name: [name] MRN: [id number] DOB: 1960-11-18 Today's Date: 12/25/2023   History of Present Illness   63 y.o. female presents to the ER with complaints of shortness of breath. Patient states she has been having shortness of breath for many weeks now but over the last 24 hours it got worse. Past history of COPD with ongoing tobacco abuse, Raynaud's disease     Clinical Impressions Pt resting comfortably in bed, feeling back to 100 percent. Pt lives alone, can call family/friends/neighbors if needed for assistance. PLOF independent, works in [employer] 12 hour shifts, hot environment, exacerbates COPD sometimes. Pt currently at baseline, independent, educated on energy conservation strategies and on forming new, healthy habits to reduce tobacco use. Pt smokes a pack a day, has been trying to quit smoking off and on for years but unsuccessful. Spoke with Pt about strategies to reduce smoking in home, creating healthy habits, energy conservation strategies when exacerbated. Pt has no further acute OT or follow up needs, no DME needed. Signing off.      If plan is discharge home, recommend the following:         Functional Status Assessment   Patient has had a recent decline in their functional status and demonstrates the ability to make significant improvements in function in a reasonable and predictable amount of time.     Equipment Recommendations   None recommended by OT     Recommendations for Other Services         Precautions/Restrictions   Precautions Precautions: None Recall of Precautions/Restrictions: Intact Restrictions Weight Bearing Restrictions Per Provider Order: No     Mobility Bed Mobility Overal bed mobility: Independent                  Transfers Overall transfer level: Independent                        Balance Overall balance assessment: Independent                                          ADL either performed or assessed with clinical judgement   ADL Overall ADL's : Independent                                             Vision Baseline Vision/History: 0 No visual deficits Ability to See in Adequate Light: 0 Adequate Patient Visual Report: No change from baseline       Perception         Praxis         Pertinent Vitals/Pain Pain Assessment Pain Assessment: No/denies pain     Extremity/Trunk Assessment Upper Extremity Assessment Upper Extremity Assessment: Overall WFL for tasks assessed   Lower Extremity Assessment Lower Extremity Assessment: Defer to PT evaluation       Communication Communication Communication: No apparent difficulties   Cognition Arousal: Alert Behavior During Therapy: WFL for tasks assessed/performed Cognition: No apparent impairments                               Following commands: Intact       Cueing  General Comments   Cueing Techniques: Verbal  cues      Exercises     Shoulder Instructions      Home Living Family/patient expects to be discharged to:: Private residence Living Arrangements: Alone Available Help at Discharge: Family;Friend(s);Neighbor;Available PRN/intermittently Type of Home: House       Home Layout: One level     Bathroom Shower/Tub: Tub/shower unit         Home Equipment: None          Prior Functioning/Environment Prior Level of Function : Independent/Modified Independent             Mobility Comments: works in [employer] facility 12 hour shifts, hot environment, exacerbates COPD sometimes      OT Problem List: Decreased activity tolerance   OT Treatment/Interventions:        OT Goals(Current goals can be found in the care plan section)   Acute Rehab OT Goals Patient Stated Goal: to return home, quit smoking OT Goal Formulation: With patient Time For Goal Achievement: 01/08/24 Potential  to Achieve Goals: Good   OT Frequency:       Co-evaluation              AM-PAC OT 6 Clicks Daily Activity     Outcome Measure Help from another person eating meals?: None Help from another person taking care of personal grooming?: None Help from another person toileting, which includes using toliet, bedpan, or urinal?: None Help from another person bathing (including washing, rinsing, drying)?: None Help from another person to put on and taking off regular upper body clothing?: None Help from another person to put on and taking off regular lower body clothing?: None 6 Click Score: 24   End of Session Nurse Communication: Mobility status  Activity Tolerance: Patient tolerated treatment well Patient left: in bed;with call bell/phone within reach  OT Visit Diagnosis: Other (comment) (decreased activity tolerance)                Time: 0454-0981 OT Time Calculation (min): 15 min Charges:  OT General Charges $OT Visit: 1 Visit OT Evaluation $OT Eval Low Complexity: 1 Low  [address], OTR/L   Scherry Curtis 12/25/2023, 12:25 PM

## 2023-12-25 NOTE — Discharge Summary (Incomplete)
 Physician Discharge Summary   Patient: [name] MRN: [id number] DOB: 1960-12-21  Admit date:     12/24/2023  Discharge date: 12/25/23  Discharge Physician: Aisha Hove   PCP: Roselind Congo, MD   Recommendations at discharge:  {Tip this will not be part of the note when signed- Example include specific recommendations for outpatient follow-up, pending tests to follow-up on. (Optional):26781}  PCP follow up in 1 week. Pulmonology follow up as scheduled.  Discharge Diagnoses: Principal Problem:   COPD exacerbation (HCC) Active Problems:   Raynaud's phenomenon   Hyponatremia   Tobacco abuse  Resolved Problems:   * No resolved hospital problems. [facility] Course: No notes on file  Assessment and Plan: No notes have been filed under this hospital service. Service: Hospitalist     {Tip this will not be part of the note when signed Body mass index is 18.4 kg/m. , ,  (Optional):26781}  {(NOTE) Pain control PDMP Statment (Optional):26782} Consultants: *** Procedures performed: ***  Disposition: {Plan; Disposition:26390} Diet recommendation:  Discharge Diet Orders (From admission, onward)     Start     Ordered   12/25/23 0000  Diet - low sodium heart healthy        12/25/23 1201           {Diet_Plan:26776} DISCHARGE MEDICATION: Allergies as of 12/25/2023       Reactions   Fluoxetine Other (See Comments)   Wigged me out        Medication List     STOP taking these medications    ibuprofen 200 MG tablet Commonly known as: ADVIL       TAKE these medications    albuterol  108 (90 Base) MCG/ACT inhaler Commonly known as: VENTOLIN  HFA Inhale 2 puffs into the lungs every 6 (six) hours as needed for wheezing or shortness of breath.   alendronate 70 MG tablet Commonly known as: FOSAMAX Take 70 mg by mouth once a week. Take with a full glass of water on an empty stomach. Start taking on: December 27, 2023   aspirin  EC 81 MG  tablet Take 1 tablet (81 mg total) by mouth daily.   atorvastatin  20 MG tablet Commonly known as: LIPITOR Take 1 tablet (20 mg total) by mouth daily.   azithromycin  500 MG tablet Commonly known as: ZITHROMAX  Take 1 tablet (500 mg total) by mouth daily for 2 days. Start taking on: December 26, 2023   CALCIUM  + VITAMIN D3 PO Take 600 mg by mouth daily.   Compressor/Nebulizer Misc 1 each by Does not apply route every 6 (six) hours as needed.   DULoxetine 60 MG capsule Commonly known as: CYMBALTA Take 60 mg by mouth daily.   hydroxychloroquine 200 MG tablet Commonly known as: PLAQUENIL Take 200 mg by mouth at bedtime.   ipratropium-albuterol  0.5-2.5 (3) MG/3ML Soln Commonly known as: DUONEB Take 3 mLs by nebulization every 6 (six) hours as needed.   multivitamin with minerals Tabs tablet Take 1 tablet by mouth daily.   predniSONE  10 MG tablet Commonly known as: DELTASONE  Take 4 tablets (40 mg total) by mouth daily with breakfast for 2 days, THEN 3 tablets (30 mg total) daily with breakfast for 2 days, THEN 2 tablets (20 mg total) daily with breakfast for 2 days, THEN 1 tablet (10 mg total) daily with breakfast for 2 days. Start taking on: December 25, 2023   Stiolto Respimat  2.5-2.5 MCG/ACT Aers Generic drug: Tiotropium Bromide-Olodaterol INHALE 2 PUFFS BY MOUTH INTO THE  LUNGS DAILY        Discharge Exam: Filed Weights   12/25/23 0012  Weight: 54.9 kg   ***  Condition at discharge: {DC Condition:26389}  The results of significant diagnostics from this hospitalization (including imaging, microbiology, ancillary and laboratory) are listed below for reference.   Imaging Studies: DG Chest Port 1 View Result Date: 12/24/2023 CLINICAL DATA:  Shortness of breath EXAM: PORTABLE CHEST 1 VIEW COMPARISON:  CT 12/05/2023, radiograph 06/14/2018 FINDINGS: Hyperinflation with emphysema. No acute airspace disease or effusion. Stable cardiomediastinal silhouette. No pneumothorax  IMPRESSION: No active disease. Hyperinflation with emphysema. Electronically Signed   By: Esmeralda Hedge M.D.   On: 12/24/2023 19:50   CT CHEST LUNG CA SCREEN LOW DOSE W/O CM Result Date: 12/24/2023 CLINICAL DATA:  63 year old female with 46 pack-year history of smoking. Lung cancer screening. EXAM: CT CHEST WITHOUT CONTRAST LOW-DOSE FOR LUNG CANCER SCREENING TECHNIQUE: Multidetector CT imaging of the chest was performed following the standard protocol without IV contrast. RADIATION DOSE REDUCTION: This exam was performed according to the departmental dose-optimization program which includes automated exposure control, adjustment of the mA and/or kV according to patient size and/or use of iterative reconstruction technique. COMPARISON:  08/31/2019 FINDINGS: Cardiovascular: The heart size is normal. No substantial pericardial effusion. Coronary artery calcification is evident. Mild atherosclerotic calcification is noted in the wall of the thoracic aorta. Mediastinum/Nodes: No mediastinal lymphadenopathy. No evidence for gross hilar lymphadenopathy although assessment is limited by the lack of intravenous contrast on the current study. The esophagus has normal imaging features. There is no axillary lymphadenopathy. Lungs/Pleura: Centrilobular and paraseptal emphysema evident. Biapical pleuroparenchymal scarring evident. Scattered tiny bilateral pulmonary nodules identified previously are stable in the interval. 4.1 mm nodule in the posterior right upper lobe (image 152) is new in the interval. No focal airspace consolidation. No pulmonary edema or pleural effusion. Upper Abdomen: Visualized portion of the upper abdomen shows no acute findings. Musculoskeletal: No worrisome lytic or sclerotic osseous abnormality. IMPRESSION: 1. Lung-RADS 3, probably benign findings. 4.1 mm nodule in the posterior right upper lobe is new in the interval.Short-term follow-up in 6 months is recommended with repeat low-dose chest CT  without contrast (please use the following order, CT CHEST LCS NODULE FOLLOW-UP W/O CM). 2. Aortic Atherosclerosis (ICD10-I70.0) and Emphysema (ICD10-J43.9). These results will be called to the ordering clinician or representative by the Radiologist Assistant, and communication documented in the PACS or [employer]. Electronically Signed   By: Donnal Fusi M.D.   On: 12/24/2023 12:14    Microbiology: Results for orders placed or performed during the hospital encounter of 12/24/23  Resp panel by RT-PCR (RSV, Flu A&B, Covid) Anterior Nasal Swab     Status: None   Collection Time: 12/24/23  8:10 PM   Specimen: Anterior Nasal Swab  Result Value Ref Range Status   SARS Coronavirus 2 by RT PCR NEGATIVE NEGATIVE Final   Influenza A by PCR NEGATIVE NEGATIVE Final   Influenza B by PCR NEGATIVE NEGATIVE Final    Comment: (NOTE) The Xpert Xpress SARS-CoV-2/FLU/RSV plus assay is intended as an aid in the diagnosis of influenza from Nasopharyngeal swab specimens and should not be used as a sole basis for treatment. Nasal washings and aspirates are unacceptable for Xpert Xpress SARS-CoV-2/FLU/RSV testing.  Fact Sheet for Patients: BloggerCourse.com  Fact Sheet for Healthcare Providers: SeriousBroker.it  This test is not yet approved or cleared by the [place]  FDA and has been authorized for detection and/or diagnosis of SARS-CoV-2 by FDA under  an Emergency Use Authorization (EUA). This EUA will remain in effect (meaning this test can be used) for the duration of the COVID-19 declaration under Section 564(b)(1) of the Act, 21 U.S.C. section 360bbb-3(b)(1), unless the authorization is terminated or revoked.     Resp Syncytial Virus by PCR NEGATIVE NEGATIVE Final    Comment: (NOTE) Fact Sheet for Patients: BloggerCourse.com  Fact Sheet for Healthcare  Providers: SeriousBroker.it  This test is not yet approved or cleared by the [place]  FDA and has been authorized for detection and/or diagnosis of SARS-CoV-2 by FDA under an Emergency Use Authorization (EUA). This EUA will remain in effect (meaning this test can be used) for the duration of the COVID-19 declaration under Section 564(b)(1) of the Act, 21 U.S.C. section 360bbb-3(b)(1), unless the authorization is terminated or revoked.  Performed at [facility], [address] [place], [place] [address]     Labs: CBC: Recent Labs  Lab 12/24/23 1935 12/24/23 1944 12/25/23 0315  WBC 14.5*  --  9.8  NEUTROABS 11.9*  --   --   HGB 14.8 15.6* 14.5  HCT 44.8 46.0 43.3  MCV 96.8  --  95.4  PLT 203  --  195   Basic Metabolic Panel: Recent Labs  Lab 12/24/23 1935 12/24/23 1944 12/25/23 0315  NA 133* 134* 137  K 4.4 4.5 4.5  CL 99  --  103  CO2 25  --  24  GLUCOSE 92  --  146*  BUN 13  --  12  CREATININE 0.97  --  0.84  CALCIUM  9.2  --  9.0   Liver Function Tests: No results for input(s): AST, ALT, ALKPHOS, BILITOT, PROT, ALBUMIN in the last 168 hours. CBG: No results for input(s): GLUCAP in the last 168 hours.  Discharge time spent: {LESS THAN/GREATER FIEP:32951} 30 minutes.  Signed: Aisha Hove, MD [facility] 12/25/2023

## 2023-12-25 NOTE — Plan of Care (Signed)

## 2023-12-26 ENCOUNTER — Telehealth: Payer: Self-pay

## 2023-12-26 DIAGNOSIS — J9601 Acute respiratory failure with hypoxia: Secondary | ICD-10-CM

## 2023-12-26 NOTE — Telephone Encounter (Signed)
 Copied from CRM [id number]. Topic: Clinical - Order For Equipment >> Dec 26, 2023  8:41 AM Isabell A wrote: Reason for CRM: Patient states she was in the hospital - theyve sent her home with the nebulizer solution but she doesnt have the actual machine. >> Dec 26, 2023  8:43 AM Isabell A wrote: Requesting a call back with more information.  Pt states she just needs a nebulizer machine due to having the solution that was RX by the hospital. Pt was put on a duoneb and does not have a machine. No previous DME. I informed pt that I would send an order to [facility] for a machine and supplies for her. Pt verbalized understanding. NFN  Sending order to Dara Ear, NP as pt saw her on 12-22-23. Pt has not seen Dr Bertrum Brodie since 07-27-21.

## 2023-12-26 NOTE — Discharge Summary (Signed)
 Physician Discharge Summary   Patient: [name] MRN: [id number] DOB: Aug 23, 1960  Admit date:     12/24/2023  Discharge date: 12/25/2023  Discharge Physician: Aisha Hove   PCP: Roselind Congo, MD   Recommendations at discharge:    PCP follow up in 1 week  Discharge Diagnoses: Principal Problem:   COPD exacerbation (HCC) Active Problems:   Raynaud's phenomenon   Hyponatremia   Tobacco abuse  Resolved Problems:   * No resolved hospital problems. *  Hospital Course: [name] is a 63 y.o. female with history of COPD with ongoing tobacco abuse, Raynaud's disease presents to the ER with complaints of shortness of breath.  Patient states she has been having shortness of breath for many weeks now but over the last 24 hours it got worse.  Denies any chest pain fever or chills.  Has been having productive cough with whitish colored sputum.   Patient is admitted for COPD exacerbation management.  Assessment and Plan: COPD exacerbation - She did require 2L supplemental oxygen initially. Symptoms did improve with IV steroids, duonebs, azithromycin . Her oxygen weaned off, able to ambulate without sob. New scripts for nebulizer, duoneb, steroid taper, abx sent  to pharmacy. Advised to quit smoking. Advised PCP, pulmonary follow up as scheduled. History of Raynaud's disease used to be on amlodipine  but patient stopped taking it after blood pressure was getting low. Tobacco abuse advised about quitting. Counseled ill effects of tobacco. Motivated to quit.  Mild hyponatremia improved. Lung nodule seen on the recent CAT scan done for lung cancer screening.  Advised to follow PCP/ pulmonology.      Consultants: none Procedures performed: none  Disposition: Home Diet recommendation:  Discharge Diet Orders (From admission, onward)     Start     Ordered   12/25/23 0000  Diet - low sodium heart healthy        12/25/23 1201           Cardiac  diet DISCHARGE MEDICATION: Allergies as of 12/25/2023       Reactions   Fluoxetine Other (See Comments)   Wigged me out        Medication List     STOP taking these medications    ibuprofen 200 MG tablet Commonly known as: ADVIL       TAKE these medications    albuterol  108 (90 Base) MCG/ACT inhaler Commonly known as: VENTOLIN  HFA Inhale 2 puffs into the lungs every 6 (six) hours as needed for wheezing or shortness of breath.   alendronate 70 MG tablet Commonly known as: FOSAMAX Take 70 mg by mouth once a week. Take with a full glass of water on an empty stomach. Start taking on: December 27, 2023   aspirin  EC 81 MG tablet Take 1 tablet (81 mg total) by mouth daily.   atorvastatin  20 MG tablet Commonly known as: LIPITOR Take 1 tablet (20 mg total) by mouth daily.   azithromycin  500 MG tablet Commonly known as: ZITHROMAX  Take 1 tablet (500 mg total) by mouth daily for 2 days.   CALCIUM  + VITAMIN D3 PO Take 600 mg by mouth daily.   Compressor/Nebulizer Misc 1 each by Does not apply route every 6 (six) hours as needed.   DULoxetine 60 MG capsule Commonly known as: CYMBALTA Take 60 mg by mouth daily.   hydroxychloroquine 200 MG tablet Commonly known as: PLAQUENIL Take 200 mg by mouth at bedtime.   ipratropium-albuterol  0.5-2.5 (3) MG/3ML Soln Commonly known as: DUONEB  Take 3 mLs by nebulization every 6 (six) hours as needed.   multivitamin with minerals Tabs tablet Take 1 tablet by mouth daily.   predniSONE  10 MG tablet Commonly known as: DELTASONE  Take 4 tablets (40 mg total) by mouth daily with breakfast for 2 days, THEN 3 tablets (30 mg total) daily with breakfast for 2 days, THEN 2 tablets (20 mg total) daily with breakfast for 2 days, THEN 1 tablet (10 mg total) daily with breakfast for 2 days. Start taking on: December 25, 2023   Stiolto Respimat  2.5-2.5 MCG/ACT Aers Generic drug: Tiotropium Bromide-Olodaterol INHALE 2 PUFFS BY MOUTH INTO THE LUNGS  DAILY        Discharge Exam: Filed Weights   12/25/23 0012  Weight: 54.9 kg      12/25/2023    8:36 AM 12/25/2023    4:30 AM 12/25/2023   12:12 AM  Vitals with BMI  Height   5' 8  Weight   121 lbs 1 oz  BMI   18.41  Systolic 132 120   Diastolic 82 79   Pulse 95 74     General - Elderly thin built Caucasian female, no apparent distress HEENT - PERRLA, EOMI, atraumatic head, non tender sinuses. Lung - Clear, no rales, rhonchi, diffuse wheezes. Heart - S1, S2 heard, no murmurs, rubs, no pedal edema. Abdomen - Soft, non tender, bowel sounds good Neuro - Alert, awake and oriented x 3, non focal exam. Skin - Warm and dry.  Condition at discharge: stable  The results of significant diagnostics from this hospitalization (including imaging, microbiology, ancillary and laboratory) are listed below for reference.   Imaging Studies: DG Chest Port 1 View Result Date: 12/24/2023 CLINICAL DATA:  Shortness of breath EXAM: PORTABLE CHEST 1 VIEW COMPARISON:  CT 12/05/2023, radiograph 06/14/2018 FINDINGS: Hyperinflation with emphysema. No acute airspace disease or effusion. Stable cardiomediastinal silhouette. No pneumothorax IMPRESSION: No active disease. Hyperinflation with emphysema. Electronically Signed   By: Esmeralda Hedge M.D.   On: 12/24/2023 19:50   CT CHEST LUNG CA SCREEN LOW DOSE W/O CM Result Date: 12/24/2023 CLINICAL DATA:  63 year old female with 46 pack-year history of smoking. Lung cancer screening. EXAM: CT CHEST WITHOUT CONTRAST LOW-DOSE FOR LUNG CANCER SCREENING TECHNIQUE: Multidetector CT imaging of the chest was performed following the standard protocol without IV contrast. RADIATION DOSE REDUCTION: This exam was performed according to the departmental dose-optimization program which includes automated exposure control, adjustment of the mA and/or kV according to patient size and/or use of iterative reconstruction technique. COMPARISON:  08/31/2019 FINDINGS: Cardiovascular:  The heart size is normal. No substantial pericardial effusion. Coronary artery calcification is evident. Mild atherosclerotic calcification is noted in the wall of the thoracic aorta. Mediastinum/Nodes: No mediastinal lymphadenopathy. No evidence for gross hilar lymphadenopathy although assessment is limited by the lack of intravenous contrast on the current study. The esophagus has normal imaging features. There is no axillary lymphadenopathy. Lungs/Pleura: Centrilobular and paraseptal emphysema evident. Biapical pleuroparenchymal scarring evident. Scattered tiny bilateral pulmonary nodules identified previously are stable in the interval. 4.1 mm nodule in the posterior right upper lobe (image 152) is new in the interval. No focal airspace consolidation. No pulmonary edema or pleural effusion. Upper Abdomen: Visualized portion of the upper abdomen shows no acute findings. Musculoskeletal: No worrisome lytic or sclerotic osseous abnormality. IMPRESSION: 1. Lung-RADS 3, probably benign findings. 4.1 mm nodule in the posterior right upper lobe is new in the interval.Short-term follow-up in 6 months is recommended with repeat low-dose chest CT  without contrast (please use the following order, CT CHEST LCS NODULE FOLLOW-UP W/O CM). 2. Aortic Atherosclerosis (ICD10-I70.0) and Emphysema (ICD10-J43.9). These results will be called to the ordering clinician or representative by the Radiologist Assistant, and communication documented in the PACS or [employer]. Electronically Signed   By: Donnal Fusi M.D.   On: 12/24/2023 12:14    Microbiology: Results for orders placed or performed during the hospital encounter of 12/24/23  Resp panel by RT-PCR (RSV, Flu A&B, Covid) Anterior Nasal Swab     Status: None   Collection Time: 12/24/23  8:10 PM   Specimen: Anterior Nasal Swab  Result Value Ref Range Status   SARS Coronavirus 2 by RT PCR NEGATIVE NEGATIVE Final   Influenza A by PCR NEGATIVE NEGATIVE Final    Influenza B by PCR NEGATIVE NEGATIVE Final    Comment: (NOTE) The Xpert Xpress SARS-CoV-2/FLU/RSV plus assay is intended as an aid in the diagnosis of influenza from Nasopharyngeal swab specimens and should not be used as a sole basis for treatment. Nasal washings and aspirates are unacceptable for Xpert Xpress SARS-CoV-2/FLU/RSV testing.  Fact Sheet for Patients: BloggerCourse.com  Fact Sheet for Healthcare Providers: SeriousBroker.it  This test is not yet approved or cleared by the [place]  FDA and has been authorized for detection and/or diagnosis of SARS-CoV-2 by FDA under an Emergency Use Authorization (EUA). This EUA will remain in effect (meaning this test can be used) for the duration of the COVID-19 declaration under Section 564(b)(1) of the Act, 21 U.S.C. section 360bbb-3(b)(1), unless the authorization is terminated or revoked.     Resp Syncytial Virus by PCR NEGATIVE NEGATIVE Final    Comment: (NOTE) Fact Sheet for Patients: BloggerCourse.com  Fact Sheet for Healthcare Providers: SeriousBroker.it  This test is not yet approved or cleared by the [place]  FDA and has been authorized for detection and/or diagnosis of SARS-CoV-2 by FDA under an Emergency Use Authorization (EUA). This EUA will remain in effect (meaning this test can be used) for the duration of the COVID-19 declaration under Section 564(b)(1) of the Act, 21 U.S.C. section 360bbb-3(b)(1), unless the authorization is terminated or revoked.  Performed at [facility], [address], [place], [place] [address]     Labs: CBC: Recent Labs  Lab 12/24/23 1935 12/24/23 1944 12/25/23 0315  WBC 14.5*  --  9.8  NEUTROABS 11.9*  --   --   HGB 14.8 15.6* 14.5  HCT 44.8 46.0 43.3  MCV 96.8  --  95.4  PLT 203  --  195   Basic Metabolic Panel: Recent Labs  Lab 12/24/23 1935  12/24/23 1944 12/25/23 0315  NA 133* 134* 137  K 4.4 4.5 4.5  CL 99  --  103  CO2 25  --  24  GLUCOSE 92  --  146*  BUN 13  --  12  CREATININE 0.97  --  0.84  CALCIUM  9.2  --  9.0   Liver Function Tests: No results for input(s): AST, ALT, ALKPHOS, BILITOT, PROT, ALBUMIN in the last 168 hours. CBG: No results for input(s): GLUCAP in the last 168 hours.  Discharge time spent: 37 minutes. Time spent on smoking cessation 6 min.  Signed: Aisha Hove, MD [facility] 12/26/2023

## 2024-01-01 ENCOUNTER — Telehealth: Payer: Self-pay | Admitting: *Deleted

## 2024-01-01 NOTE — Telephone Encounter (Signed)
 Copied from CRM [id number]. Topic: Clinical - Order For Equipment >> Jan 01, 2024  3:35 PM Joesph PARAS wrote: Reason for CRM: Patient is calling to state she has heard nothing from [employer] following an order being sent for her to get a nebulizer. She has attempted to contact them and states that they keep telling her they are having technical difficulties. Patient needs machine, as she has not had any breathing treatments since she got out of the hospital a week ago.  Patient requesting a call to update her on situation/progress.

## 2024-01-02 ENCOUNTER — Telehealth: Payer: Self-pay | Admitting: Acute Care

## 2024-01-02 ENCOUNTER — Telehealth: Payer: Self-pay

## 2024-01-02 ENCOUNTER — Other Ambulatory Visit

## 2024-01-02 NOTE — Telephone Encounter (Signed)
 I spoke with Mitch regarding the order. The order is good, but it requires an addendum from Lauraine or a progress note explaining the need for the nebulizer. But Laur[facility] isn't back until the 30th. Is there another provider we could have?

## 2024-01-02 NOTE — Telephone Encounter (Signed)
 Copied from CRM [id number]. Topic: General - Other >> Jan 02, 2024 10:07 AM Rilla B wrote: Reason for CRM: Patient called. States she has been going back and fourth over a week now. Patient states this is an emergency and she needs her equipment. Please call patient with update.   ATC X1. LMTCB  Order was received by [employer] [facility] so pt would need to contact them to see when she can receive this. [facility]- [phone]

## 2024-01-02 NOTE — Telephone Encounter (Signed)
 Copied from CRM [id number]. Topic: Clinical - Order For Equipment >> Jan 02, 2024  2:32 PM Devaughn RAMAN wrote: Reason for CRM: Patient called returning phone call, advised patient of Ashlyn message and provided [employer] phone number. Patient stated [employer] informed her they needed a verbal authorization from the provider to ensure her and insurance to cover the cost of the DME equipment.

## 2024-01-02 NOTE — Telephone Encounter (Signed)
 Copied from CRM [id number]. Topic: Clinical - Order For Equipment >> Jan 02, 2024 11:27 AM Celestine FALCON wrote: Reason for CRM: [facility] from [facility] is calling due to the order that was sent over was verbal cosigned. She stated she was able to pull all the needed info, but there needs to be an order sent over via fax [phone] [facility] for the nebulizer signed by provider (I believe Dr. Geronimo is her pulm provider). >> Jan 02, 2024  1:43 PM Sherlean HERO wrote: The referral with the ATTN : [facility] NEBULIZER HAS BEEN FAXED OVER  To:               Recipient at [phone] Subject:          Olivia Masker Result:           The transmission was successful. Explanation:      All Pages Ok Pages Sent:       2 Connect Time:     1 minutes, 12 seconds Transmit Time:    01/02/2024 12:05 Transfer Rate:    14400 Status Code:      0000 Retry Count:      0 Job Id:           [id number] Unique Id:        [id number] Fax Line:         80 Fax Server:       [id number] NFN >> Jan 02, 2024 11:55 AM Sherlean HERO wrote: Faxing over the referral with signature.

## 2024-01-05 NOTE — Telephone Encounter (Signed)
 Spoke to [employer]. They will send the nebulizer over to the patient and will call us  if there are any issues.

## 2024-01-05 NOTE — Telephone Encounter (Signed)
 Spoke with patient regarding prior message . Patient stated that [employer] is sending some information back to the office to have the provider fill out .Advised patient that I need to send this message to the [facility] for them to look into this . Patient was not nice about it when I have told the patient that .  Patient stated she has not had a breathing treatment since she has left the hospital.  Pcc's can you please help with this. Thank you

## 2024-01-05 NOTE — Telephone Encounter (Addendum)
 We have not received any fax from [employer] regarding this. We will need an addendum from Lauraine or a progress note that explains the medical necessity for the nebulizer from the previous telephone encounter. I'll give [employer] a call to see if they can fax over the order to us .

## 2024-01-06 ENCOUNTER — Telehealth: Payer: Self-pay

## 2024-01-06 DIAGNOSIS — J441 Chronic obstructive pulmonary disease with (acute) exacerbation: Secondary | ICD-10-CM | POA: Diagnosis not present

## 2024-01-06 DIAGNOSIS — J9601 Acute respiratory failure with hypoxia: Secondary | ICD-10-CM | POA: Diagnosis not present

## 2024-01-06 NOTE — Telephone Encounter (Signed)
 Copied from CRM [id number]. Topic: Clinical - Medical Advice >> Jan 05, 2024 10:16 AM Rozanna MATSU wrote: Reason for CRM: PT CALLED STATED [facility] IS NEEDING A NARRATIVE OR EXPLANATION BEFORE THEY CAN GET HER EQUIPMENT TO HER. PT STATED HIS HAS BEEN ALMOST 2WEEKS SINCE SHE GOT OUT THE HOSPITAL PLEASE CONTACT PATIENT IF NEED TO.   This already done .

## 2024-01-23 DIAGNOSIS — M5459 Other low back pain: Secondary | ICD-10-CM | POA: Diagnosis not present

## 2024-01-23 DIAGNOSIS — M47816 Spondylosis without myelopathy or radiculopathy, lumbar region: Secondary | ICD-10-CM | POA: Diagnosis not present

## 2024-01-23 DIAGNOSIS — M79672 Pain in left foot: Secondary | ICD-10-CM | POA: Diagnosis not present

## 2024-02-06 DIAGNOSIS — J441 Chronic obstructive pulmonary disease with (acute) exacerbation: Secondary | ICD-10-CM | POA: Diagnosis not present

## 2024-02-06 DIAGNOSIS — J9601 Acute respiratory failure with hypoxia: Secondary | ICD-10-CM | POA: Diagnosis not present

## 2024-02-13 DIAGNOSIS — R2242 Localized swelling, mass and lump, left lower limb: Secondary | ICD-10-CM | POA: Diagnosis not present

## 2024-03-02 ENCOUNTER — Other Ambulatory Visit: Payer: Self-pay | Admitting: Adult Health

## 2024-03-02 ENCOUNTER — Ambulatory Visit (INDEPENDENT_AMBULATORY_CARE_PROVIDER_SITE_OTHER): Admitting: Adult Health

## 2024-03-02 ENCOUNTER — Encounter: Payer: Self-pay | Admitting: Adult Health

## 2024-03-02 VITALS — BP 100/70 | HR 67 | Temp 97.7°F | Ht 68.0 in | Wt 118.4 lb

## 2024-03-02 DIAGNOSIS — F17210 Nicotine dependence, cigarettes, uncomplicated: Secondary | ICD-10-CM | POA: Diagnosis not present

## 2024-03-02 DIAGNOSIS — J9601 Acute respiratory failure with hypoxia: Secondary | ICD-10-CM | POA: Insufficient documentation

## 2024-03-02 DIAGNOSIS — Z720 Tobacco use: Secondary | ICD-10-CM

## 2024-03-02 DIAGNOSIS — J441 Chronic obstructive pulmonary disease with (acute) exacerbation: Secondary | ICD-10-CM | POA: Diagnosis not present

## 2024-03-02 DIAGNOSIS — E440 Moderate protein-calorie malnutrition: Secondary | ICD-10-CM | POA: Diagnosis not present

## 2024-03-02 DIAGNOSIS — J449 Chronic obstructive pulmonary disease, unspecified: Secondary | ICD-10-CM

## 2024-03-02 MED ORDER — IPRATROPIUM-ALBUTEROL 0.5-2.5 (3) MG/3ML IN SOLN: 3.0000 mL | Freq: Four times a day (QID) | RESPIRATORY_TRACT | 1 refills | Status: AC | PRN

## 2024-03-02 MED ORDER — STIOLTO RESPIMAT 2.5-2.5 MCG/ACT IN AERS: 12.0000 g | INHALATION_SPRAY | Freq: Every day | RESPIRATORY_TRACT | 3 refills | Status: DC

## 2024-03-02 MED ORDER — ALBUTEROL SULFATE HFA 108 (90 BASE) MCG/ACT IN AERS: 2.0000 | INHALATION_SPRAY | Freq: Four times a day (QID) | RESPIRATORY_TRACT | 3 refills | Status: AC | PRN

## 2024-03-02 NOTE — Patient Instructions (Signed)
 Continue on Stiolto 2 puffs daily  Albuterol  inhaler or Duoneb As needed   Work on quitting smoking  1800QUITNOW Activity as tolerated  CT chest as planned in November.  Follow up in Dr. Geronimo in 4 months with PFT and As needed

## 2024-03-02 NOTE — Progress Notes (Signed)
 "  @Patient  ID: [name], female    DOB: Jun 10, 1961, 63 y.o.   MRN: [id number]  Chief Complaint  Patient presents with   COPD    Referring provider: Arloa Elsie SAUNDERS, MD  HPI: 63 yo smoker followed for severe COPD Participates in the Lung Cancer CT yearly screening  Medical history of Raynauds and Lupus   TEST/EVENTS :  11/2023 Lung RADS 3 , 4 mm nodule RUL, Emphysema , repeat in 6 months  HRCT Chest 05/2021 neg for ILD, Emphysema, scattered pulmonary nodules   PFTs January 2023 FEV1 45%, ratio 52, FVC 67%, DLCO 36%  03/02/2024 Follow up: COPD, Post hospital follow up  Discussed the use of AI scribe software for clinical note transcription with the patient, who gave verbal consent to proceed.  History of Present Illness [name] is a 63 year old female with COPD and emphysema who presents for follow-up after recent hospitalization for a COPD exacerbation.  She was hospitalized in June for a COPD exacerbation and received treatment with antibiotics, steroids, and nebulized bronchodilators, which led to improvement. She was discharged without the need for supplemental oxygen. A chest x-ray during hospitalization showed no pneumonia/acute process.   She participates in the lung cancer CT chest screening program that was completed on Dec 05, 2023 that showed stable emphysema.  Biapical scarring.  Tiny pulmonary nodules.  4.1 right upper lobe nodule was new.  She has a follow-up CT scan in 6 months.    She is currently taking Stiolto daily (two puffs) and uses an albuterol  inhaler and DuoNeb nebulizer as needed. She uses albuterol  at work due to her 12-hour shifts and has requested a three-month supply for convenience.   She has a history of Raynaud's and was diagnosed with lupus in March, for which she is taking Plaquenil. She is awaiting an appointment with a new rheumatologist after her previous one left.  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 Despite understanding the negative health impacts, she finds quitting smoking challenging.  She experiences shortness of breath and acknowledges her breathing is 'not good.' Her last pulmonary function test was two years ago, showing severe COPD with emphysema   No hemoptysis and she does not receive flu vaccines.      Allergies  Allergen Reactions   Fluoxetine Other (See Comments)    Wigged me out     There is no immunization history on file for this patient.  Past Medical History:  Diagnosis Date   Raynaud disease    Tobacco use     Tobacco History: Social History   Tobacco Use  Smoking Status Every Day   Current packs/day: 1.50   Average packs/day: 1.5 packs/day for 40.0 years (60.0 ttl pk-yrs)   Types: Cigarettes  Smokeless Tobacco Never  Tobacco Comments   pt currently smoking 1ppd as of 03/02/24 hfb RN   Ready to quit: Not Answered Counseling given: Not Answered Tobacco comments: pt currently smoking 1ppd as of 03/02/24 hfb RN   Outpatient Medications Prior to Visit  Medication Sig Dispense Refill   albuterol  (VENTOLIN  HFA) 108 (90 Base) MCG/ACT inhaler Inhale 2 puffs into the lungs every 6 (six) hours as needed for wheezing or shortness of breath. 6.7 g 2   alendronate (FOSAMAX) 70 MG tablet Take 70 mg by mouth once a week. Take with a  full glass of water on an empty stomach.     aspirin  EC 81 MG tablet Take 1 tablet (81 mg total) by mouth daily.     atorvastatin  (LIPITOR) 20 MG tablet Take 1 tablet (20 mg total) by mouth daily. 90 tablet 1   Calcium  Carb-Cholecalciferol (CALCIUM  + VITAMIN D3 PO) Take 600 mg by mouth daily.     DULoxetine (CYMBALTA) 60 MG capsule Take 60 mg by mouth daily.     hydroxychloroquine (PLAQUENIL) 200 MG tablet Take 200 mg by mouth at bedtime.      ipratropium-albuterol  (DUONEB) 0.5-2.5 (3) MG/3ML SOLN Take 3 mLs by nebulization every 6 (six) hours as needed. 360 mL 1   Multiple Vitamin (MULTIVITAMIN WITH MINERALS) TABS Take 1 tablet by mouth daily.     Nebulizers (COMPRESSOR/NEBULIZER) MISC 1 each by Does not apply route every 6 (six) hours as needed. 1 each 0   STIOLTO RESPIMAT  2.5-2.5 MCG/ACT AERS INHALE 2 PUFFS BY MOUTH INTO THE LUNGS DAILY 12 g 3   No facility-administered medications prior to visit.     Review of Systems:   Constitutional:   No  weight loss, night sweats,  Fevers, chills, +fatigue, or  lassitude.  HEENT:   No headaches,  Difficulty swallowing,  Tooth/dental problems, or  Sore throat,                No sneezing, itching, ear ache, nasal congestion, post nasal drip,   CV:  No chest pain,  Orthopnea, PND, swelling in lower extremities, anasarca, dizziness, palpitations, syncope.   GI  No heartburn, indigestion, abdominal pain, nausea, vomiting, diarrhea, change in bowel habits, loss of appetite, bloody stools.   Resp: No shortness of breath with exertion or at rest.  No excess mucus, no productive cough,  No non-productive cough,  No coughing up of blood.  No change in color of mucus.  No wheezing.  No chest wall deformity  Skin: no rash or lesions.  GU: no dysuria, change in color of urine, no urgency or frequency.  No flank pain, no hematuria   MS:  No joint pain or swelling.  No decreased range of motion.  No back pain.    Physical Exam  BP 100/70   Pulse 67   Temp 97.7 F (36.5 C) (Oral)   Ht 5' 8 (1.727 m)   Wt 118 lb 6.4 oz (53.7 kg)   SpO2 98% Comment: RA  BMI 18.00 kg/m   GEN: A/Ox3; pleasant , NAD, well nourished , thin    HEENT:  [place]/AT,  NOSE-clear, THROAT-clear, no lesions, no postnasal drip or exudate noted.   NECK:  Supple w/ fair ROM; no JVD; normal carotid impulses w/o bruits; no thyromegaly or nodules palpated; no lymphadenopathy.    RESP  Clear  P & A; w/o, wheezes/ rales/ or  rhonchi. no accessory muscle use, no dullness to percussion  CARD:  RRR, no m/r/g, no peripheral edema, pulses intact, no cyanosis or clubbing.  GI:   Soft & nt; nml bowel sounds; no organomegaly or masses detected.   Musco: Warm bil, no deformities or joint swelling noted.   Neuro: alert, no focal deficits noted.    Skin: Warm, no lesions or rashes    Lab Results:  CBC    BNP No results found for: BNP  ProBNP No results found for: PROBNP  Imaging: No results found.  Administration History     None          Latest Ref  Rng & Units 07/27/2021    1:53 PM 02/28/2020    8:48 AM  PFT Results  FVC-Pre L 2.61  2.35   FVC-Predicted Pre % 67  59   FVC-Post L  2.53   FVC-Predicted Post %  64   Pre FEV1/FVC % % 52  52   Post FEV1/FCV % %  48   FEV1-Pre L 1.36  1.22   FEV1-Predicted Pre % 45  40   FEV1-Post L  1.20   DLCO uncorrected ml/min/mmHg 8.56  9.55   DLCO UNC% % 36  40   DLCO corrected ml/min/mmHg 8.56  9.55   DLCO COR %Predicted % 36  40   DLVA Predicted % 43  47   TLC L  6.79   TLC % Predicted %  118   RV % Predicted %  181     No results found for: NITRICOXIDE      Assessment & Plan:   No problem-specific Assessment & Plan notes found for this encounter.  Assessment and Plan Assessment & Plan Chronic obstructive pulmonary disease with emphysema   She has severe COPD with emphysema and a history of exacerbation requiring hospitalization in June. Currently, she is managed with Stiolto, albuterol , and DuoNeb as needed, with no oxygen requirement post-hospitalization. Her last pulmonary function test showed 45% lung function with low diffusing capacity. She experiences persistent dyspnea, especially with exertion. Low body weight may impact her overall health and COPD management. Continue Stiolto daily and use albuterol  and DuoNeb as needed. Order a pulmonary function test for the next visit. Encourage a high protein diet and daily walking to  improve muscle strength and stamina. Administer the influenza vaccine.  Pulmonary nodules under surveillance   Pulmonary nodules were identified on a CT scan and are under surveillance, with a follow-up scan planned for late October or early November. Ensure the follow-up CT scan is scheduled for late October or early November. Advise her to contact the office if not notified by the end of October.  Tobacco dependence   She has long-standing tobacco dependence and difficulty quitting, though she expresses a desire to quit. Discussed strategies for smoking cessation, including gradual reduction, use of sugarless candy and gum, and setting short-term goals. Advised against using nicotine  patches until cigarette consumption is reduced to five per day. Discussed potential use of antidepressants like Wellbutrin for smoking cessation, but advised consulting with her family doctor. Emphasized the negative health impacts of smoking, including increased heart rate, blood pressure, and cancer risk. Provide the 1-800-QUIT-NOW number for smoking cessation support. Encourage gradual reduction of cigarette consumption by one cigarette per week and discuss the potential use of Wellbutrin with her family doctor.  Protein calorie malnutrition. Her low body weight potentially impacts overall health and COPD management. Encourage a high protein diet to increase weight and monitor her weight and nutritional status.   Plan  Patient Instructions  Continue on Stiolto 2 puffs daily  Albuterol  inhaler or Duoneb As needed   Work on quitting smoking  1800QUITNOW Activity as tolerated  CT chest as planned in November.  Follow up in Dr. Geronimo in 4 months with PFT and As needed       Latasha Stank, NP 03/02/2024  "

## 2024-03-05 DIAGNOSIS — F331 Major depressive disorder, recurrent, moderate: Secondary | ICD-10-CM | POA: Diagnosis not present

## 2024-03-05 DIAGNOSIS — F411 Generalized anxiety disorder: Secondary | ICD-10-CM | POA: Diagnosis not present

## 2024-03-08 DIAGNOSIS — J441 Chronic obstructive pulmonary disease with (acute) exacerbation: Secondary | ICD-10-CM | POA: Diagnosis not present

## 2024-03-19 DIAGNOSIS — M359 Systemic involvement of connective tissue, unspecified: Secondary | ICD-10-CM | POA: Diagnosis not present

## 2024-03-19 DIAGNOSIS — I7300 Raynaud's syndrome without gangrene: Secondary | ICD-10-CM | POA: Diagnosis not present

## 2024-03-19 DIAGNOSIS — M1990 Unspecified osteoarthritis, unspecified site: Secondary | ICD-10-CM | POA: Diagnosis not present

## 2024-03-19 DIAGNOSIS — F17200 Nicotine dependence, unspecified, uncomplicated: Secondary | ICD-10-CM | POA: Diagnosis not present

## 2024-04-07 DIAGNOSIS — J9601 Acute respiratory failure with hypoxia: Secondary | ICD-10-CM | POA: Diagnosis not present

## 2024-04-07 DIAGNOSIS — J441 Chronic obstructive pulmonary disease with (acute) exacerbation: Secondary | ICD-10-CM | POA: Diagnosis not present

## 2024-04-09 ENCOUNTER — Encounter: Payer: Self-pay | Admitting: Cardiology

## 2024-04-09 ENCOUNTER — Ambulatory Visit: Attending: Cardiology | Admitting: Cardiology

## 2024-04-09 ENCOUNTER — Other Ambulatory Visit (HOSPITAL_COMMUNITY): Payer: Self-pay

## 2024-04-09 ENCOUNTER — Ambulatory Visit: Admitting: Cardiology

## 2024-04-09 VITALS — BP 104/66 | HR 61 | Ht 68.0 in | Wt 115.2 lb

## 2024-04-09 DIAGNOSIS — I2510 Atherosclerotic heart disease of native coronary artery without angina pectoris: Secondary | ICD-10-CM | POA: Diagnosis not present

## 2024-04-09 DIAGNOSIS — R0989 Other specified symptoms and signs involving the circulatory and respiratory systems: Secondary | ICD-10-CM | POA: Insufficient documentation

## 2024-04-09 DIAGNOSIS — E782 Mixed hyperlipidemia: Secondary | ICD-10-CM | POA: Insufficient documentation

## 2024-04-09 DIAGNOSIS — R0609 Other forms of dyspnea: Secondary | ICD-10-CM | POA: Diagnosis not present

## 2024-04-09 DIAGNOSIS — F17210 Nicotine dependence, cigarettes, uncomplicated: Secondary | ICD-10-CM | POA: Diagnosis not present

## 2024-04-09 LAB — LIPID PANEL
Chol/HDL Ratio: 2.8 ratio (ref 0.0–4.4)
Cholesterol, Total: 166 mg/dL (ref 100–199)
HDL: 59 mg/dL (ref >39)
LDL Chol Calc (NIH): 94 mg/dL (ref 0–99)
Triglycerides: 68 mg/dL (ref 0–149)
VLDL Cholesterol Cal: 13 mg/dL (ref 5–40)

## 2024-04-09 MED ORDER — IVABRADINE HCL 5 MG PO TABS
10.0000 mg | ORAL_TABLET | Freq: Once | ORAL | 0 refills | Status: AC
  Filled 2024-04-09: qty 2, 1d supply, fill #0

## 2024-04-09 MED ORDER — NICOTINE 14 MG/24HR TD PT24
14.0000 mg | MEDICATED_PATCH | Freq: Every day | TRANSDERMAL | 2 refills | Status: DC
  Filled 2024-04-09: qty 28, 28d supply, fill #0

## 2024-04-09 MED ORDER — AMLODIPINE BESYLATE 2.5 MG PO TABS
2.5000 mg | ORAL_TABLET | Freq: Every day | ORAL | 2 refills | Status: AC
  Filled 2024-04-09: qty 90, 90d supply, fill #0

## 2024-04-09 MED ORDER — ATORVASTATIN CALCIUM 40 MG PO TABS
40.0000 mg | ORAL_TABLET | Freq: Every day | ORAL | 3 refills | Status: DC
  Filled 2024-04-09: qty 90, 90d supply, fill #0

## 2024-04-09 NOTE — Patient Instructions (Addendum)
 Medication Instructions:  INCREASE Atorvastatin  to 40 mg daily  START Nicotine patch 14 mg   DAY OF CT:  ivabradine (CORLANOR) 5 MG TABS tablet         Take 2 tablets (10 mg total) by mouth once for 1 dose. Take 90-120 minutes prior to scan.    *If you need a refill on your cardiac medications before your next appointment, please call your pharmacy*  Lab Work: Lipid panel in 3 months   If you have labs (blood work) drawn today and your tests are completely normal, you will receive your results only by: MyChart Message (if you have MyChart) OR A paper copy in the mail If you have any lab test that is abnormal or we need to change your treatment, we will call you to review the results.  Testing/Procedures: Echocardiogram  Your physician has requested that you have an echocardiogram. Echocardiography is a painless test that uses sound waves to create images of your heart. It provides your doctor with information about the size and shape of your heart and how well your heart's chambers and valves are working. This procedure takes approximately one hour. There are no restrictions for this procedure. Please do NOT wear cologne, perfume, aftershave, or lotions (deodorant is allowed). Please arrive 15 minutes prior to your appointment time.  Please note: We ask at that you not bring children with you during ultrasound (echo/ vascular) testing. Due to room size and safety concerns, children are not allowed in the ultrasound rooms during exams. Our front office staff cannot provide observation of children in our lobby area while testing is being conducted. An adult accompanying a patient to their appointment will only be allowed in the ultrasound room at the discretion of the ultrasound technician under special circumstances. We apologize for any inconvenience.   Coronary CTA   Your physician has requested that you have cardiac CT. Cardiac computed tomography (CT) is a painless test that uses an  x-ray machine to take clear, detailed pictures of your heart. For further information please visit [email or web address] Please follow instruction sheet as given.    ABI  Your physician has requested that you have a lower or upper extremity arterial duplex. This test is an ultrasound of the arteries in the legs or arms. It looks at arterial blood flow in the legs and arms. Allow one hour for Lower and Upper Arterial scans. There are no restrictions or special instructions.  Please note: We ask at that you not bring children with you during ultrasound (echo/ vascular) testing. Due to room size and safety concerns, children are not allowed in the ultrasound rooms during exams. Our front office staff cannot provide observation of children in our lobby area while testing is being conducted. An adult accompanying a patient to their appointment will only be allowed in the ultrasound room at the discretion of the ultrasound technician under special circumstances. We apologize for any inconvenience.   Follow-Up: At [facility], you and your health needs are our priority.  As part of our continuing mission to provide you with exceptional heart care, our providers are all part of one team.  This team includes your primary Cardiologist (physician) and Advanced Practice Providers or APPs (Physician Assistants and Nurse Practitioners) who all work together to provide you with the care you need, when you need it.  Your next appointment:   3 month(s)  Provider:   One of our Advanced Practice Providers (APPs): Morse Clause, PA-C  Lamarr Satterfield,  NP Miriam Shams, NP  Olivia Pavy, PA-C Josefa Beauvais, NP  Leontine Salen, PA-C Orren Fabry, PA-C  [place], PA-C Ernest Dick, NP  Damien Braver, NP Jon Hails, PA-C  Waddell Donath, PA-C    Dayna Dunn, PA-C  Scott Weaver, PA-C Lum Louis, NP Katlyn West, NP Callie Goodrich, PA-C  Xika Zhao, NP Sheng Haley, PA-C    Kathleen Johnson, PA-C   We  recommend signing up for the patient portal called MyChart.  Sign up information is provided on this After Visit Summary.  MyChart is used to connect with patients for Virtual Visits (Telemedicine).  Patients are able to view lab/test results, encounter notes, upcoming appointments, etc.  Non-urgent messages can be sent to your provider as well.   To learn more about what you can do with MyChart, go to [email or web address].   Other Instructions   Your cardiac CT will be scheduled at one of the below locations:  [facility] [address]  [place], [place] [address] [phone]  If scheduled at the Heart and Vascular Tower at [facility] street, please enter the parking lot using the [address] entrance and use the FREE valet service at the patient drop-off area. Enter the building and check-in with registration on the main floor.  Please follow these instructions carefully (unless otherwise directed):  An IV will be required for this test and Nitroglycerin will be given.  Hold all erectile dysfunction medications at least 3 days (72 hrs) prior to test. (Ie viagra, cialis, sildenafil, tadalafil, etc)   On the Night Before the Test: Be sure to Drink plenty of water. Do not consume any caffeinated/decaffeinated beverages or chocolate 12 hours prior to your test. Do not take any antihistamines 12 hours prior to your test.   On the Day of the Test: Drink plenty of water until 1 hour prior to the test. Do not eat any food 1 hour prior to test. You may take your regular medications prior to the test.  Take IVABRADINE two hours prior to test. If you take Furosemide/Hydrochlorothiazide/Spironolactone/Chlorthalidone, please HOLD on the morning of the test. Patients who wear a continuous glucose monitor MUST remove the device prior to scanning. FEMALES- please wear underwire-free bra if available, avoid dresses & tight clothing   After the Test: Drink  plenty of water. After receiving IV contrast, you may experience a mild flushed feeling. This is normal. On occasion, you may experience a mild rash up to 24 hours after the test. This is not dangerous. If this occurs, you can take Benadryl 25 mg, Zyrtec, Claritin, or Allegra and increase your fluid intake. (Patients taking Tikosyn should avoid Benadryl, and may take Zyrtec, Claritin, or Allegra) If you experience trouble breathing, this can be serious. If it is severe call 911 IMMEDIATELY. If it is mild, please call our office.  We will call to schedule your test 2-4 weeks out understanding that some insurance companies will need an authorization prior to the service being performed.   For more information and frequently asked questions, please visit our website : [email or web address]  For non-scheduling related questions, please contact the cardiac imaging nurse navigator should you have any questions/concerns: Cardiac Imaging Nurse Navigators Direct Office Dial: [phone]   For scheduling needs, including cancellations and rescheduling, please call [place], [phone].

## 2024-04-09 NOTE — Progress Notes (Signed)
 Cardiology Office Note:  .   Date:  04/09/2024  ID:  [name], DOB 13-May-1961, MRN [id number] PCP: Arloa Elsie SAUNDERS, MD  [facility] Providers Cardiologist:  Newman Lawrence, MD PCP: Arloa Elsie SAUNDERS, MD  Chief Complaint  Patient presents with   Aortic atherosclerosis   Coronary Artery Disease     [name] is a 63 y.o. female with coronary artery disease, aortic atherosclerosis, nicotine dependence, COPD/emphysema, Raynaud's phenomenon  Discussed the use of AI scribe software for clinical note transcription with the patient, who gave verbal consent to proceed.  History of Present Illness [name] is a 63 year old female with coronary artery disease who presents with shortness of breath and smoking cessation. She was referred by her gynecologist for evaluation of coronary artery disease.  She experiences shortness of breath, which she attributes to her long history of smoking. She has been smoking for 45 years and currently smokes about a pack a day. She has attempted smoking cessation in the past using Chantix , but experienced increased depression and nausea, leading her to discontinue it.  A CT scan in May for lung cancer screening identified coronary artery calcification. She is currently taking aspirin  81 mg and Lipitor (atorvastatin ) 20 mg daily for about seven years. Her primary care provider previously discontinued amlodipine  due to low blood pressure, but she is currently back on it and prefers to have her prescriptions managed by her cardiologist.  She has a history of Raynaud's syndrome, affecting her toes, particularly in the winter, with previous bluish-black discoloration and sores that have since improved. No pain in her legs or calves when walking.  She has COPD and emphysema, using an albuterol  inhaler and a nebulizer with albuterol  and another medication for episodes of shortness of breath. No chest pain or tightness when  walking.      Vitals:   04/09/24 0827  BP: 104/66  Pulse: 61      Review of Systems  Cardiovascular:  Positive for dyspnea on exertion. Negative for chest pain, leg swelling, palpitations and syncope.        Studies Reviewed: SABRA        EKG 04/09/2024: Sinus rhythm with Premature atrial complexes Possible Left atrial enlargement Anterolateral infarct , age undetermined When compared with ECG of 24-Dec-2023 19:48, No significant change was found     CT chest 11/2023: 1. Lung-RADS 3, probably benign findings. 4.1 mm nodule in the posterior right upper lobe is new in the interval.Short-term follow-up in 6 months is recommended with repeat low-dose chest CT without contrast (please use the following order, CT CHEST LCS NODULE FOLLOW-UP W/O CM). 2. Aortic Atherosclerosis (ICD10-I70.0) and Emphysema (ICD10-J43.9). Cardiovascular: The heart size is normal. No substantial pericardial effusion. Coronary artery calcification is evident. Mild atherosclerotic calcification is noted in the wall of the thoracic aorta.  Labs 3-12/2023: Chol 177, TG 78, HDL 61, LDL 102 Hb 14.5 Cr 0.84   Physical Exam Vitals and nursing note reviewed.  Constitutional:      General: She is not in acute distress. Neck:     Vascular: No JVD.  Cardiovascular:     Rate and Rhythm: Normal rate and regular rhythm.     Pulses:          Dorsalis pedis pulses are 0 on the right side and 1+ on the left side.       Posterior tibial pulses are 1+ on the right side and 1+ on the left side.  Heart sounds: Normal heart sounds. No murmur heard. Pulmonary:     Effort: Pulmonary effort is normal.     Breath sounds: Normal breath sounds. No wheezing or rales.  Musculoskeletal:     Right lower leg: No edema.     Left lower leg: No edema.      VISIT DIAGNOSES:   ICD-10-CM   1. Coronary artery disease involving native coronary artery of native heart without angina pectoris  I25.10 EKG 12-Lead     CT CORONARY MORPH W/CTA COR W/SCORE W/CA W/CM &/OR WO/CM    Lipid panel    Lipid panel    Basic metabolic panel with GFR    2. Mixed hyperlipidemia  E78.2 Lipid panel    Lipid panel    3. Cigarette nicotine dependence without complication  F17.210     4. Exertional dyspnea  R06.09 CT CORONARY MORPH W/CTA COR W/SCORE W/CA W/CM &/OR WO/CM    ECHOCARDIOGRAM COMPLETE    Basic metabolic panel with GFR    5. Abnormal pulse  R09.89 VAS US  ABI WITH/WO TBI       [name] is a 63 y.o. female with coronary artery disease, aortic atherosclerosis, nicotine dependence, COPD/emphysema, Raynaud's phenomenon  Assessment & Plan Atherosclerotic heart disease of native coronary artery with coronary artery calcification: Coronary artery calcification noted on CT scan. Differential includes coronary artery disease and emphysema. No chest pain reported. Suspect exertional dyspnea is more likely due to emphysema, but obstructive CAD cannot be excluded. Recommend echocardiogram, coronary CT angiogram. Continue aspirin  81 mg daily. Increase atorvastatin  to 40 mg daily. Repeat lipid panel in three months.  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 Continue amlodipine , seems to have helped per symptoms of Raynaud's phenomena. Check ABI. Ideally, would like her to be on losartan given suspected PAD.  However, given low normal blood pressure, that may not be possible at this time.   See below regarding smoking cessation.  Nicotine dependence: Tobacco cessation counseling: - Currently smoking 1 pack/day   - Patient was informed of the dangers of tobacco abuse including stroke, cancer, and MI, as well as benefits of tobacco cessation. - Patient is willing to quit at this time. - Approximately 5 mins were spent counseling patient cessation techniques. We discussed various methods to help quit smoking,  including deciding on a date to quit, joining a support group, pharmacological agents. Patient would like to use nicotine patch.. - I will reassess her progress at the next follow-up visit    Meds ordered this encounter  Medications   ivabradine (CORLANOR) 5 MG TABS tablet    Sig: Take 2 tablets (10 mg total) by mouth once for 1 dose. Take 90-120 minutes prior to scan.    Dispense:  2 tablet    Refill:  0   atorvastatin  (LIPITOR) 40 MG tablet    Sig: Take 1 tablet (40 mg total) by mouth daily.    Dispense:  90 tablet    Refill:  3   amLODipine  (NORVASC ) 2.5 MG tablet    Sig: Take 1 tablet (2.5 mg total) by mouth daily.    Dispense:  90 tablet    Refill:  2   nicotine (NICODERM CQ - DOSED IN MG/24 HOURS) 14 mg/24hr patch    Sig: Place 1 patch (14 mg total) onto the skin daily.    Dispense:  84 patch    Refill:  2  F/u in 3 months  Signed, Newman JINNY Lawrence, MD

## 2024-04-10 LAB — BASIC METABOLIC PANEL WITH GFR
BUN/Creatinine Ratio: 17 (ref 12–28)
BUN: 17 mg/dL (ref 8–27)
CO2: 25 mmol/L (ref 20–29)
Calcium: 10.1 mg/dL (ref 8.7–10.3)
Chloride: 99 mmol/L (ref 96–106)
Creatinine, Ser: 0.99 mg/dL (ref 0.57–1.00)
Glucose: 81 mg/dL (ref 70–99)
Potassium: 4.3 mmol/L (ref 3.5–5.2)
Sodium: 141 mmol/L (ref 134–144)
eGFR: 64 mL/min/1.73 (ref >59)

## 2024-04-29 ENCOUNTER — Telehealth (HOSPITAL_COMMUNITY): Payer: Self-pay | Admitting: *Deleted

## 2024-04-29 NOTE — Telephone Encounter (Signed)
 Reaching out to patient to offer assistance regarding upcoming cardiac imaging study; pt verbalizes understanding of appt date/time, parking situation and where to check in, pre-test NPO status and medications ordered, and verified current allergies; name and call back number provided for further questions should they arise Latasha Seats RN Navigator Cardiac Imaging [name] [phone] office [phone] cell

## 2024-04-29 NOTE — Telephone Encounter (Signed)
 Attempted to call patient regarding upcoming cardiac CT appointment. Left message on voicemail with name and callback number Sid Seats RN Navigator Cardiac Imaging [facility] [phone] Office

## 2024-04-30 ENCOUNTER — Ambulatory Visit (HOSPITAL_COMMUNITY)
Admission: RE | Admit: 2024-04-30 | Discharge: 2024-04-30 | Disposition: A | Discharge status: Home / Self Care | Source: Ambulatory Visit | Attending: Cardiology | Admitting: Cardiology

## 2024-04-30 DIAGNOSIS — I2510 Atherosclerotic heart disease of native coronary artery without angina pectoris: Secondary | ICD-10-CM | POA: Diagnosis not present

## 2024-04-30 DIAGNOSIS — R0609 Other forms of dyspnea: Secondary | ICD-10-CM | POA: Insufficient documentation

## 2024-04-30 DIAGNOSIS — R931 Abnormal findings on diagnostic imaging of heart and coronary circulation: Secondary | ICD-10-CM | POA: Insufficient documentation

## 2024-04-30 MED ORDER — NITROGLYCERIN 0.4 MG SL SUBL
0.8000 mg | SUBLINGUAL_TABLET | Freq: Once | SUBLINGUAL | Status: AC
  Administered 2024-04-30: 0.8 mg via SUBLINGUAL

## 2024-04-30 MED ORDER — IOHEXOL 350 MG/ML SOLN
95.0000 mL | Freq: Once | INTRAVENOUS | Status: AC | PRN
  Administered 2024-04-30: 95 mL via INTRAVENOUS

## 2024-05-02 ENCOUNTER — Ambulatory Visit (HOSPITAL_COMMUNITY)
Admission: RE | Admit: 2024-05-02 | Discharge: 2024-05-02 | Disposition: A | Source: Ambulatory Visit | Attending: Cardiology | Admitting: Cardiology

## 2024-05-02 ENCOUNTER — Other Ambulatory Visit: Payer: Self-pay | Admitting: Cardiology

## 2024-05-02 DIAGNOSIS — R931 Abnormal findings on diagnostic imaging of heart and coronary circulation: Secondary | ICD-10-CM

## 2024-05-04 ENCOUNTER — Ambulatory Visit: Payer: Self-pay | Admitting: Cardiology

## 2024-05-04 NOTE — Progress Notes (Signed)
 Moderate nonobstructive coronary artery disease.  Continue current medications.  Check lipid panel in 07/2024 as previously recommended.  Thanks MJP

## 2024-05-07 NOTE — Telephone Encounter (Signed)
 Patient is returning call. Please advise?

## 2024-05-14 ENCOUNTER — Telehealth: Payer: Self-pay | Admitting: *Deleted

## 2024-05-14 ENCOUNTER — Ambulatory Visit (HOSPITAL_BASED_OUTPATIENT_CLINIC_OR_DEPARTMENT_OTHER)
Admission: RE | Admit: 2024-05-14 | Discharge: 2024-05-14 | Disposition: A | Discharge status: Home / Self Care | Source: Ambulatory Visit | Attending: Cardiology | Admitting: Cardiology

## 2024-05-14 ENCOUNTER — Ambulatory Visit (HOSPITAL_COMMUNITY)
Admission: RE | Admit: 2024-05-14 | Discharge: 2024-05-14 | Disposition: A | Discharge status: Home / Self Care | Source: Ambulatory Visit | Attending: Cardiology | Admitting: Cardiology

## 2024-05-14 DIAGNOSIS — R0609 Other forms of dyspnea: Secondary | ICD-10-CM

## 2024-05-14 DIAGNOSIS — R0989 Other specified symptoms and signs involving the circulatory and respiratory systems: Secondary | ICD-10-CM | POA: Diagnosis not present

## 2024-05-14 MED ORDER — STIOLTO RESPIMAT 2.5-2.5 MCG/ACT IN AERS: 2.0000 | INHALATION_SPRAY | Freq: Every day | RESPIRATORY_TRACT | 11 refills | Status: AC

## 2024-05-14 NOTE — Telephone Encounter (Signed)
 Copied from CRM [id number]. Topic: Clinical - Prescription Issue >> May 14, 2024  7:50 AM [name] wrote: Reason for CRM: patient is calling about a refill the pharmacy said the prescription is unclear  Tiotropium Bromide-Olodaterol (STIOLTO RESPIMAT ) 2.5-2.5 MCG/ACT AERS Patient says pharmacy has tried to reach out but no response . Patient is needing this medication to be filled . Been out of medication for a week and is in need of medication  [id number] [address], [place] - [address] [place] [address] Phone: [phone] Fax: [phone] Hours: Not open 24 hours  I called and spoke with patient, she stated that a prescription was sent in, but the insurance would not accept how it was written (2 puffs).  I let her know that is how it is prescribed.  I let her know I would call the pharmacy and see what is going on with the prescription.  Prescription resent as well.  I figured out what was wrong with the script, it comes up to inhale 12 g daily instead of 2 puffs in the instructions when you pull up the medication to prescribe.  I fixed the instructions and sent the prescription.  Everything is right now and she should have no problems getting it filled.

## 2024-05-15 LAB — VAS US ABI WITH/WO TBI
Left ABI: 1.14
Right ABI: 1.04

## 2024-05-17 LAB — ECHOCARDIOGRAM COMPLETE
Area-P 1/2: 3.6 cm2
S' Lateral: 2.34 cm

## 2024-05-17 NOTE — Progress Notes (Signed)
 Reviewed echocardiogram and lower extremity ABI. Noncritical peripheral artery disease.  Recommend current medical management, no smoking. Echocardiogram raises question of possible constrictive pericarditis, although some of the abnormalities could be related to her underlying COPD as well.  She does not have any symptoms of acute pericarditis, therefore okay not to check ESR CRP for now. Reasonable to proceed with cardiac MRI given exertional dyspnea and abnormal echocardiogram.  Thanks MJP

## 2024-05-20 ENCOUNTER — Telehealth: Payer: Self-pay | Admitting: Cardiology

## 2024-05-20 NOTE — Telephone Encounter (Signed)
  Reviewed echocardiogram and lower extremity ABI. Noncritical peripheral artery disease.  Recommend current medical management, no smoking. Echocardiogram raises question of possible constrictive pericarditis, although some of the abnormalities could be related to her underlying COPD as well.  She does not have any symptoms of acute pericarditis, therefore okay not to check ESR CRP for now. Reasonable to proceed with cardiac MRI given exertional dyspnea and abnormal echocardiogram.   Thanks MJP        Reviewed results pt who states understanding.  No current symptoms of pericarditis as mentioned above.  Pt is asking if she needs a cardiac MRI since she just had a Coronary CT.  Advised I will forward this to MD for review.  She is aware she will be called back with any further orders or instructions.

## 2024-05-20 NOTE — Telephone Encounter (Signed)
 Patient was returning call. Patient states that she may not be able to answer the phone, but its okay to leave a detail message. Please advise

## 2024-05-20 NOTE — Telephone Encounter (Signed)
 Cardiac MRI would be looking at separate question of whether she has constrictive pericarditis.  CT scan was not adequate enough to answer this question.  The patient is unsure, okay to hold off cardiac MRI and can be further discussed during upcoming office visit with [facility] on 05/28/2024.  Thanks MJP

## 2024-05-20 NOTE — Telephone Encounter (Signed)
 Left a detailed message regarding Dr. Gilda response per pt's DPR on file. Pt told to call our office to let us  know if they want to continue moving forward with the cardiac MRI, otherwise we will discuss further at appt on Friday 05/28/24 with [place].

## 2024-05-21 NOTE — Progress Notes (Signed)
 Please review telephone encounter

## 2024-05-26 ENCOUNTER — Encounter: Payer: Self-pay | Admitting: *Deleted

## 2024-05-28 ENCOUNTER — Telehealth: Payer: Self-pay | Admitting: Podiatry

## 2024-05-28 ENCOUNTER — Encounter: Payer: Self-pay | Admitting: Emergency Medicine

## 2024-05-28 ENCOUNTER — Ambulatory Visit: Attending: Emergency Medicine | Admitting: Emergency Medicine

## 2024-05-28 VITALS — BP 98/70 | HR 80 | Ht 68.0 in | Wt 112.0 lb

## 2024-05-28 DIAGNOSIS — F17210 Nicotine dependence, cigarettes, uncomplicated: Secondary | ICD-10-CM

## 2024-05-28 DIAGNOSIS — E785 Hyperlipidemia, unspecified: Secondary | ICD-10-CM

## 2024-05-28 DIAGNOSIS — Z79899 Other long term (current) drug therapy: Secondary | ICD-10-CM | POA: Diagnosis not present

## 2024-05-28 DIAGNOSIS — R0609 Other forms of dyspnea: Secondary | ICD-10-CM

## 2024-05-28 DIAGNOSIS — I311 Chronic constrictive pericarditis: Secondary | ICD-10-CM | POA: Diagnosis not present

## 2024-05-28 DIAGNOSIS — I2510 Atherosclerotic heart disease of native coronary artery without angina pectoris: Secondary | ICD-10-CM

## 2024-05-28 DIAGNOSIS — I7300 Raynaud's syndrome without gangrene: Secondary | ICD-10-CM

## 2024-05-28 NOTE — Telephone Encounter (Signed)
 Called and left a voicemail informing patient we do have toe caps for purchase at our front desk

## 2024-05-28 NOTE — Patient Instructions (Addendum)
 Medication Instructions:  NO CHANGES  Lab Work: BMET AND FASTING LIPID PANEL TO BE DONE TODAY.  Testing/Procedures: Your physician has requested that you have a cardiac MRI. Cardiac MRI uses a computer to create images of your heart as its beating, producing both still and moving pictures of your heart and major blood vessels. For further information please visit [email or web address]. Please follow the instruction sheet given to you today for more information.   Follow-Up: At [facility], you and your health needs are our priority.  As part of our continuing mission to provide you with exceptional heart care, our providers are all part of one team.  This team includes your primary Cardiologist (physician) and Advanced Practice Providers or APPs (Physician Assistants and Nurse Practitioners) who all work together to provide you with the care you need, when you need it.  Your next appointment:   3 MONTHS  Provider:   Newman JINNY Lawrence, MD OR Lum Louis, NP

## 2024-05-28 NOTE — Progress Notes (Signed)
 Cardiology Office Note:    Date:  05/28/2024  ID:  [name], DOB 1961/04/05, MRN [id number] PCP: Arloa Elsie SAUNDERS, MD   HeartCare Providers Cardiologist:  Newman JINNY Lawrence, MD Cardiology APP:  Rana Lum CROME, NP       Patient Profile:       Chief Complaint: 6-week follow-up History of Present Illness:  [name] is a 63 y.o. female with visit-pertinent history of coronary artery disease, aortic atherosclerosis, nicotine  dependence, COPD/emphysema, Raynaud's phenomenon  She was last seen in clinic on 04/09/2024 by Dr. Lawrence.  She was referred by her gynecologist for evaluation of coronary artery disease.  She experiences shortness of breath which she attributed to long history of smoking.  Currently smoking about a pack a day.  A CT scan in May 2025 for lung cancer screening identified coronary artery calcification.  She was taking aspirin  and Lipitor.  She underwent coronary CTA on 04/30/2024 showed coronary calcium  score 271 (94th percentile) with moderate atherosclerosis 50-69% to mid-distal RCA/mid LAD.  FFR analysis demonstrated no hemodynamically flow-limiting lesions.  General and ABIs on 05/2024 showing left and right ankle-brachial index within the normal range.  Echocardiogram 05/14/2024 showed LVEF 50 to 55%, no RWMA, diastolic parameters were indeterminate, RV function mildly reduced in the apex, RV size mildly enlarged, mildly elevated PASP at 43.3 mmHg, respiratory related ventricular septal shift with differential diagnosis of constrictive pericarditis versus elevated right-sided heart pressures with prominent respirophasic septal shift, small pericardial effusion present with no evidence of tamponade, trivial MR.   Discussed the use of AI scribe software for clinical note transcription with the patient, who gave verbal consent to proceed.  History of Present Illness [name] is a 63 year old female with coronary artery disease  and COPD who presents for 6-week follow-up for dyspnea on exertion.  She experiences exertional dyspnea ongoing dyspnea on exertion.  She denies any chest pains, orthopnea, PND, LEE.  She has been smoking for the past 45 years and since last office visit has decreased from 1 pack/day to 10 cigarettes daily.  Has tried Chantix  in the past but was unsuccessful.  She has had no changes in her dyspnea on exertion since last office visit.  No chest pains or chest pressure when active.  She is motivated to quit smoking.  No lightheadedness, dizziness, syncope or presyncope.   Review of systems:  Please see the history of present illness. All other systems are reviewed and otherwise negative.      Studies Reviewed:        Coronary CTA 04/30/2024 1. Coronary calcium  score of 271. This was 94th percentile for age-, sex, and race-matched controls.   2. Normal coronary origin with right dominance.   3. Moderate atherosclerosis: 50-69% mid-distal RCA/mid LAD.   4. This study has been submitted for FFR analysis.   5. Consider 2D echo to assess degree of LVH.  FFR 1. Coronary CTA FFR analysis demonstrates no hemodynamically flow limiting lesions.  ABIs 05/14/2024 Right: Resting right ankle-brachial index is within normal range.  Right toe pressure is >60 mmHg which suggests adequate perfusion for  healing.    Left: Resting left ankle-brachial index is within normal range. The left  toe-brachial index is abnormal.   Echocardiogram 05/14/2024 1. Left ventricular ejection fraction, by estimation, is 50 to 55%. The  left ventricle has low normal function. The left ventricle has no regional  wall motion abnormalities. Left ventricular diastolic parameters are  indeterminate. The  average left  ventricular global longitudinal strain is -17.5 %. The global longitudinal  strain is normal.   2. Right ventricular systolic function mildly reduced at apex. The right  ventricular size is mildly  enlarged. There is mildly elevated pulmonary  artery systolic pressure. The estimated right ventricular systolic  pressure is 43.3 mmHg.   3. Respiratory related ventricular septal shift. No definite hepatic vein  diastolic reversals. Dilated IVC with normal respiratory collapse. No  significant variation in mitral inflow. Findings in combination are  indeterminate for constriction.  Differential diagnosis remains constrictive pericarditis vs elevated right  sided heart pressures with prominent respirophasic septal shift. SABRA a small  pericardial effusion is present. The pericardial effusion is  circumferential. There is no evidence of  cardiac tamponade.   4. The mitral valve is grossly normal. Trivial mitral valve  regurgitation. No evidence of mitral stenosis.   5. The aortic valve is tricuspid. There is mild calcification of the  aortic valve. Aortic valve regurgitation is not visualized. No aortic  stenosis is present.   6. The inferior vena cava is dilated in size with >50% respiratory  variability, suggesting right atrial pressure of 8 mmHg.  Risk Assessment/Calculations:              Physical Exam:   VS:  BP 98/70 (BP Location: Left Arm, Patient Position: Sitting, Cuff Size: Normal)   Pulse 80   Ht 5' 8 (1.727 m)   Wt 112 lb (50.8 kg)   BMI 17.03 kg/m    Wt Readings from Last 3 Encounters:  05/28/24 112 lb (50.8 kg)  04/09/24 115 lb 3.2 oz (52.3 kg)  03/02/24 118 lb 6.4 oz (53.7 kg)    GEN: Well nourished, well developed in no acute distress NECK: No JVD; No carotid bruits CARDIAC: RRR, no murmurs, rubs, gallops RESPIRATORY:  Clear to auscultation without rales, wheezing or rhonchi  ABDOMEN: Soft, non-tender, non-distended EXTREMITIES:  No edema; No acute deformity      Assessment and Plan:  Coronary artery disease Coronary CTA 04/2024 with CAC of 271 (94th percentile) with moderate atherosclerosis to the mid-distal RCA and mid LAD.  FFR analysis demonstrated no  hemodynamically flow-limiting lesion Echo 05/2024 with LVEF 50 to 55% with no RWMA - Today she is without chest pains.  Continues to experience dyspnea on exertion but unchanged.  She is without symptoms concerning for active angina.  No indication further ischemic evaluation at this time - Continue aspirin  81 mg daily and atorvastatin  40 mg daily - Tobacco cessation encouraged  Hyperlipidemia, LDL goal <70 LDL 94 on 04/2024 Atorvastatin  subsequently increased on 10/3 - Plan for repeat fasting lipid panel.  Can consider increasing statin if needed to reach goal - Continue atorvastatin  40 mg daily  Raynaud's disease ABIs 05/2024 with normal left/right ankle-brachial index - Well-controlled.  No changes to current regimen - Tobacco cessation encouraged - Continue amlodipine  2.5 mg daily  COPD Tobacco use Has been smoking 1 pack/day over the past 45 years - Now down to half a pack a day over the past month and is determined to quit smoking - No recent COPD exacerbation - Tobacco cessation encouraged  Dyspnea on exertion RV dysfunction ?  Constrictive pericarditis Echocardiogram 05/2024 showed RV function mildly reduced at apex, RV size mildly enlarged, respiratory-related ventricular septal shift, dilated IVC with normal respiratory collapse.  Differential diagnosis remains constrictive pericarditis versus elevated right-sided heart pressure with prominent respirophasic septal shift - Given nonobstructive CAD on CT, suspect exertional  dyspnea was more likely due to emphysema/COPD.  However unable to rule out constrictive pericarditis given abnormal echo - She is without any symptoms concerning for acute pericarditis therefore will not check ESR or CRP today - Plan for cardiac MRI to further evaluate given exertional dyspnea and abnormal echocardiogram       Dispo:  Return in about 3 months (around 08/28/2024).  Signed, Lum LITTIE Louis, NP

## 2024-05-29 LAB — BASIC METABOLIC PANEL WITH GFR
BUN/Creatinine Ratio: 15 (ref 12–28)
BUN: 14 mg/dL (ref 8–27)
CO2: 23 mmol/L (ref 20–29)
Calcium: 10.4 mg/dL — ABNORMAL HIGH (ref 8.7–10.3)
Chloride: 102 mmol/L (ref 96–106)
Creatinine, Ser: 0.91 mg/dL (ref 0.57–1.00)
Glucose: 81 mg/dL (ref 70–99)
Potassium: 4.7 mmol/L (ref 3.5–5.2)
Sodium: 143 mmol/L (ref 134–144)
eGFR: 71 mL/min/1.73 (ref >59)

## 2024-05-29 LAB — LIPID PANEL
Chol/HDL Ratio: 2.7 ratio (ref 0.0–4.4)
Cholesterol, Total: 177 mg/dL (ref 100–199)
HDL: 65 mg/dL (ref >39)
LDL Chol Calc (NIH): 93 mg/dL (ref 0–99)
Triglycerides: 106 mg/dL (ref 0–149)
VLDL Cholesterol Cal: 19 mg/dL (ref 5–40)

## 2024-05-31 ENCOUNTER — Ambulatory Visit: Payer: Self-pay | Admitting: Emergency Medicine

## 2024-05-31 DIAGNOSIS — Z79899 Other long term (current) drug therapy: Secondary | ICD-10-CM

## 2024-05-31 MED ORDER — ATORVASTATIN CALCIUM 80 MG PO TABS: 80.0000 mg | ORAL_TABLET | Freq: Every day | ORAL | 1 refills | Status: AC

## 2024-06-01 NOTE — Telephone Encounter (Signed)
 Patient returned staff call regarding results.

## 2024-06-11 ENCOUNTER — Telehealth: Payer: Self-pay | Admitting: Emergency Medicine

## 2024-06-11 ENCOUNTER — Inpatient Hospital Stay
Admission: RE | Admit: 2024-06-11 | Discharge: 2024-06-11 | Disposition: A | Source: Ambulatory Visit | Attending: Acute Care | Admitting: Acute Care

## 2024-06-11 DIAGNOSIS — R918 Other nonspecific abnormal finding of lung field: Secondary | ICD-10-CM | POA: Diagnosis not present

## 2024-06-11 DIAGNOSIS — M7701 Medial epicondylitis, right elbow: Secondary | ICD-10-CM | POA: Diagnosis not present

## 2024-06-11 DIAGNOSIS — Z87891 Personal history of nicotine dependence: Secondary | ICD-10-CM

## 2024-06-11 DIAGNOSIS — R911 Solitary pulmonary nodule: Secondary | ICD-10-CM

## 2024-06-11 DIAGNOSIS — F17210 Nicotine dependence, cigarettes, uncomplicated: Secondary | ICD-10-CM

## 2024-06-11 DIAGNOSIS — J432 Centrilobular emphysema: Secondary | ICD-10-CM | POA: Diagnosis not present

## 2024-06-11 DIAGNOSIS — Z122 Encounter for screening for malignant neoplasm of respiratory organs: Secondary | ICD-10-CM

## 2024-06-11 DIAGNOSIS — M79641 Pain in right hand: Secondary | ICD-10-CM | POA: Diagnosis not present

## 2024-06-11 NOTE — Telephone Encounter (Signed)
 Pt calling back to ask if she is to continue taking calcium  supplement.

## 2024-06-11 NOTE — Telephone Encounter (Signed)
 Spoke with patient, she states she saw her PCP today and was advised to stop taking her calcium  supplements and multivitamin containing calcium  supplements.  Patient asked if she should also stop eating calcium -rich food as she states she eats a lot of it. Advised her to follow her PCP's recommendation, continue current diet, and be sure to follow-up with PCP to monitor calcium  levels.   Reviewed cardiac MRI instructions with patient. Provided her with the number to contact imaging nurse navigator if she has any questions.  Patient states she is very claustrophobic and requests [facility] to MRI appt. She states she has already arranged for transportation to and from the appt.  Will forward to [facility], NP to review and advise on pre-medication for anxiety/claustrophobia.

## 2024-06-14 NOTE — Telephone Encounter (Signed)
 Left message to call back.

## 2024-06-18 ENCOUNTER — Encounter (HOSPITAL_COMMUNITY): Payer: Self-pay

## 2024-06-18 ENCOUNTER — Other Ambulatory Visit: Payer: Self-pay | Admitting: Acute Care

## 2024-06-18 DIAGNOSIS — F17210 Nicotine dependence, cigarettes, uncomplicated: Secondary | ICD-10-CM

## 2024-06-18 DIAGNOSIS — M359 Systemic involvement of connective tissue, unspecified: Secondary | ICD-10-CM | POA: Diagnosis not present

## 2024-06-18 DIAGNOSIS — I7300 Raynaud's syndrome without gangrene: Secondary | ICD-10-CM | POA: Diagnosis not present

## 2024-06-18 DIAGNOSIS — Z79899 Other long term (current) drug therapy: Secondary | ICD-10-CM | POA: Diagnosis not present

## 2024-06-18 DIAGNOSIS — Z87891 Personal history of nicotine dependence: Secondary | ICD-10-CM

## 2024-06-18 DIAGNOSIS — Z122 Encounter for screening for malignant neoplasm of respiratory organs: Secondary | ICD-10-CM

## 2024-06-18 DIAGNOSIS — M1990 Unspecified osteoarthritis, unspecified site: Secondary | ICD-10-CM | POA: Diagnosis not present

## 2024-06-18 NOTE — Telephone Encounter (Signed)
 Spoke with Pt. Gave recommendation. Pt stated understanding.

## 2024-06-18 NOTE — Telephone Encounter (Signed)
 Pt returning call. Please advise.

## 2024-06-23 ENCOUNTER — Encounter (HOSPITAL_COMMUNITY): Payer: Self-pay

## 2024-06-25 ENCOUNTER — Other Ambulatory Visit: Payer: Self-pay | Admitting: Emergency Medicine

## 2024-06-25 ENCOUNTER — Ambulatory Visit (HOSPITAL_COMMUNITY)
Admission: RE | Admit: 2024-06-25 | Discharge: 2024-06-25 | Disposition: A | Discharge status: Home / Self Care | Source: Ambulatory Visit | Attending: Emergency Medicine | Admitting: Emergency Medicine

## 2024-06-25 DIAGNOSIS — I311 Chronic constrictive pericarditis: Secondary | ICD-10-CM

## 2024-06-25 DIAGNOSIS — R0609 Other forms of dyspnea: Secondary | ICD-10-CM | POA: Insufficient documentation

## 2024-06-25 MED ORDER — GADOBUTROL 1 MMOL/ML IV SOLN
8.0000 mL | Freq: Once | INTRAVENOUS | Status: AC | PRN
  Administered 2024-06-25: 8 mL via INTRAVENOUS

## 2024-07-09 ENCOUNTER — Ambulatory Visit

## 2024-07-09 DIAGNOSIS — J449 Chronic obstructive pulmonary disease, unspecified: Secondary | ICD-10-CM | POA: Diagnosis not present

## 2024-07-09 LAB — PULMONARY FUNCTION TEST
DL/VA % pred: 41 %
DL/VA: 1.69 ml/min/mmHg/L
DLCO unc % pred: 35 %
DLCO unc: 8.27 ml/min/mmHg
FEF 25-75 Post: 0.54 L/s
FEF 25-75 Pre: 0.5 L/s
FEF2575-%Change-Post: 7 %
FEF2575-%Pred-Post: 21 %
FEF2575-%Pred-Pre: 20 %
FEV1-%Change-Post: 3 %
FEV1-%Pred-Post: 40 %
FEV1-%Pred-Pre: 38 %
FEV1-Post: 1.17 L
FEV1-Pre: 1.13 L
FEV1FVC-%Change-Post: -5 %
FEV1FVC-%Pred-Pre: 62 %
FEV6-%Change-Post: 6 %
FEV6-%Pred-Post: 67 %
FEV6-%Pred-Pre: 63 %
FEV6-Post: 2.47 L
FEV6-Pre: 2.32 L
FEV6FVC-%Change-Post: -2 %
FEV6FVC-%Pred-Post: 100 %
FEV6FVC-%Pred-Pre: 103 %
FVC-%Change-Post: 9 %
FVC-%Pred-Post: 67 %
FVC-%Pred-Pre: 61 %
FVC-Post: 2.55 L
FVC-Pre: 2.33 L
Post FEV1/FVC ratio: 46 %
Post FEV6/FVC ratio: 97 %
Pre FEV1/FVC ratio: 49 %
Pre FEV6/FVC Ratio: 99 %
RV % pred: 233 %
RV: 5.31 L
TLC % pred: 138 %
TLC: 7.96 L

## 2024-07-09 NOTE — Patient Instructions (Signed)
 Full pft performed today

## 2024-07-09 NOTE — Progress Notes (Signed)
 Full pft performed today

## 2024-07-16 ENCOUNTER — Encounter: Payer: Self-pay | Admitting: Internal Medicine

## 2024-07-16 ENCOUNTER — Ambulatory Visit: Admitting: Internal Medicine

## 2024-07-16 VITALS — BP 90/60 | HR 70 | Ht 68.0 in | Wt 113.8 lb

## 2024-07-16 DIAGNOSIS — F17210 Nicotine dependence, cigarettes, uncomplicated: Secondary | ICD-10-CM | POA: Diagnosis not present

## 2024-07-16 DIAGNOSIS — Z7185 Encounter for immunization safety counseling: Secondary | ICD-10-CM

## 2024-07-16 DIAGNOSIS — J449 Chronic obstructive pulmonary disease, unspecified: Secondary | ICD-10-CM

## 2024-07-16 DIAGNOSIS — Z122 Encounter for screening for malignant neoplasm of respiratory organs: Secondary | ICD-10-CM

## 2024-07-16 DIAGNOSIS — Z720 Tobacco use: Secondary | ICD-10-CM

## 2024-07-16 MED ORDER — BREZTRI AEROSPHERE 160-9-4.8 MCG/ACT IN AERO: 2.0000 | INHALATION_SPRAY | Freq: Two times a day (BID) | RESPIRATORY_TRACT | Status: DC

## 2024-07-16 NOTE — Patient Instructions (Addendum)
 Stage 3 severe COPD by GOLD classification (HCC)  - stable on PFT and symptoms but severe disease and slowly progerssive symptoms. Just not in flare up. Fev1 stable over tie  Plan  - STOP stiolto  - start Breztri  2 puff twice daily - continue albuterol  as needed  Tobacco abuse  Too bad unable to quit  Plan  - will power for now  Lung cancer screen  - no cancer on CT Dec 2025  Plan  - next LDCT Dec 2026  Vaccine counseling  - respect deferral of flu and covid vaccine and pneumonia vaccine  Followup 3 months with app and then based on symptoms consider OThuvayre or biologic

## 2024-07-16 NOTE — Progress Notes (Signed)
 "      OV 11/08/2019  Subjective:  Patient ID: [name], female , DOB: 1961/06/23 , age 64 y.o. , MRN: [id number] , ADDRESS: [address] [place] [address]   11/08/2019 -   Chief Complaint  Patient presents with   Consult    Pt states she has been smoking x30 years and due to this, she does have occ SOB. Pt had a CT performed which she stated showed emphysema.     HPI [name] 64 y.o. -referred by primary care for COPD evaluation and diagnosis.  According to the patient she has been at baseline health.  She has a longstanding history of multiple decades of Raynaud's.  She has seen Dr. Leni once but does not want to visit there again.  She has had blood work for that.  In the remote past she is to abuse cocaine but currently smokes marijuana fume times a month.  She is also continued heavy smoker.  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  She is 1 pack a day for 35 years tobacco smoker.  For the last year she has had insidious onset of shortness of breath and cough that is progressively getting worse.  Severity symptoms are listed below.  Prior to that she was fine although earlier chest x-ray there personally visualized shows hyperinflation consistent with COPD.  She had a CT scan of the chest for lung cancer screening that I personally visualized shows emphysema no lung cancer no nodules.  Therefore she has been referred here.  She was given Spiriva but she has not started this.  She says this because of expense although she cannot specify how expensive it is.  In the past year she has tried Chantix  for quitting smoking but has not been able to quit smoking.  She says it did work.  She says it caused some constipation.  She is wearing for me to manage her quitting smoking because she wants to really quit smoking.  She says the Chantix  can also help quit marijuana use.     CT chest 08/31/2019 Lungs/Pleura: Centrilobular and paraseptal emphysema evident. Biapical  pleuroparenchymal scarring noted. Numerous bilateral scattered calcified and noncalcified pulmonary nodules are identified. Dominant noncalcified pulmonary nodule is in the left costophrenic sulcus with volume derived equivalent diameter of 6.6 mm on image 320. No focal airspace consolidation. No pleural effusion.   Upper Abdomen: Unremarkable   Musculoskeletal: No worrisome lytic or sclerotic osseous abnormality.   IMPRESSION: 1. Lung-RADS category 3, probably benign findings. Short-term follow-up in 6 months is recommended with repeat low-dose chest CT without contrast (please use the following order, CT CHEST LCS NODULE FOLLOW-UP /O CM). 2.  Emphysema (ICD10-J43.9) and Aortic Atherosclerosis (ICD10-170.0) #3.coronary artery calcification present.   Electronically Signed   By: Camellia Candle M.D.   On: 08/31/2019 12:46     OV 03/23/2020  Subjective:  Patient ID: [name], female , DOB: 1960-09-15 , age 49 y.o. , MRN: [id number] , ADDRESS: [address] [place] [address]   03/23/2020 -   Chief Complaint  Patient presents with   Follow-up    pt is here to go over pft . pt has questions about stiolto     HPI [name] 64 y.o. -presents for evaluation of her COPD lung nodules and smoking.  She has not had a flu shot Covid vaccine.  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  She quit smoking but then  in summer 2020 when there was a recall on the Chantix  because of manufacturing defect with excess levels of nitrosamines.  At this point in time according to history there is no more Chantix  being manufactured.  However she wants to start the Chantix  if it is available.  She is willing to take another prescription.  In terms of her COPD she had pulmonary function test is gold stage III severe COPD.  She is currently on Stiolto.  There is the cheapest inhaler for her.  It is working well.  She had questions about inhaler technique.  This is because some missed comes  back.  We went over this.  Last visit I ordered alpha-1 phenotype but looks like it has not been done.  She does not recollect having had blood work for this at last visit.  She did have a CT scan of the chest in February 2021.  Shows some pulmonary nodules.  A 19-month CT chest is recommended.  She is willing to have this.       OV 07/27/2021  Subjective:  Patient ID: [name], female , DOB: 1960-11-02 , age 16 y.o. , MRN: [id number] , ADDRESS: [address] [place] [address] PCP Arloa Fallow, MD Patient Care Team: Arloa Fallow, MD as PCP - General (Family Medicine) Darron Deatrice LABOR, MD as PCP - Cardiology (Cardiology)  This Provider for this visit: Treatment Team:  Attending Provider: Geronimo Amel, MD    07/27/2021 -   Chief Complaint  Patient presents with   Follow-up    PFT performed today. Pt states at times when she becomes SOB, she will have some incontinence issues.    HPI [name] 64 y.o. -returns for follow-up.  Have not seen her in a year and 4 months.  She says she continues to smoke 1 pack/day.  In the past she has tried Chantix  and it did not work well for her but she is willing to try it again.  We discussed strategies to quit smoking.  Advised her to cut down smoking by 2 cigarettes/day and by 10 days to be able to quit smoking.  This will be when she starts the Chantix .  We spent a total of 3 minutes talking about quitting smoking.  She does not want to COVID or flu shots.  In terms of her COPD she did have pulmonary function test and it is stable..  November 2022 she did have a CT scan of the chest and lung nodules stable.  They are considered benign.  Only annual low-dose CT scan of the chest is now recommended.  She is happy to hear this news.    03/02/2024 Follow up: COPD, Post hospital follow up  Discussed the use of AI scribe software for clinical note transcription with the patient, who gave verbal consent to  proceed.  History of Present Illness [name] is a 64 year old female with COPD and emphysema who presents for follow-up after recent hospitalization for a COPD exacerbation.  She was hospitalized in June for a COPD exacerbation and received treatment with antibiotics, steroids, and nebulized bronchodilators, which led to improvement. She was discharged without the need for supplemental oxygen. A chest x-ray during hospitalization showed no pneumonia/acute process.   She participates in the lung cancer CT chest screening program that was completed on Dec 05, 2023 that showed stable emphysema.  Biapical scarring.  Tiny pulmonary nodules.  4.1 right upper lobe nodule was new.  She has  a follow-up CT scan in 6 months.    She is currently taking Stiolto daily (two puffs) and uses an albuterol  inhaler and DuoNeb nebulizer as needed. She uses albuterol  at work due to her 12-hour shifts and has requested a three-month supply for convenience.   She has a history of Raynaud's and was diagnosed with lupus in March, for which she is taking Plaquenil. She is awaiting an appointment with a new rheumatologist after her previous one left.  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 Despite understanding the negative health impacts, she finds quitting smoking challenging.  She experiences shortness of breath and acknowledges her breathing is 'not good.' Her last pulmonary function test was two years ago, showing severe COPD with emphysema   No hemoptysis and she does not receive flu vaccines.      OV 07/16/2024  Subjective:  Patient ID: [name], female , DOB: 07/22/1960 , age 39 y.o. , MRN: [id number] , ADDRESS: [address] [place] [address] PCP Arloa Elsie SAUNDERS, MD Patient Care  Team: Arloa Elsie SAUNDERS, MD as PCP - General (Family Medicine) Elmira Newman PARAS, MD as PCP - Cardiology (Cardiology) Rana Lum CROME, NP as Nurse Practitioner (Cardiology)  This Provider for this visit: Treatment Team:  Attending Provider: Geronimo Amel, MD   Gold stage 3 copd - Alpha 1 MS  Smoking Lung nodules/cancer screen last CT dec 2025  07/16/2024 -   Chief Complaint  Patient presents with   Medical Management of Chronic Issues   COPD    Breathing is stable. She has some cough with clear sputum.      HPI [name] 64 y.o. -Guneet Delpino is a 64 year old female with COPD who presents with worsening shortness of breath.  She has experienced worsening dyspnea, particularly since her hospitalization in the summer of 2025, although there have been no further hospitalizations or emergency department visits since then. She has been prescribed prednisone  a couple of times since that summer.  Currently, she uses Stiolto as her maintenance inhaler for COPD and occasionally uses a rescue inhaler, albuterol , as needed. She is not on any inhaled steroids or oxygen therapy, either at night or portable. She experiences persistent dyspnea, especially with walking or climbing stairs, although she can change her clothes without difficulty.  She had a recent pulmonary function test last week. She continues to smoke.  In December, imaging showed no signs of cancer. Her phlegm remains clear, and she feels her breathing is worse compared to a year ago, although she is not experiencing a flare-up currently. No wheezing is reported.      CAT COPD Symptom & Quality of Life Score (GSK trademark) 0 is no burden. 5 is highest burden 11/08/2019   Never Cough -> Cough all the time 5  No phlegm in chest -> Chest is full of phlegm 5  No chest tightness -> Chest feels very tight 1  No dyspnea for 1 flight stairs/hill -> Very dyspneic for 1 flight of stairs 4  No limitations for ADL at  home -> Very limited with ADL at home 3  Confident leaving home -> Not at all confident leaving home 0  Sleep soundly -> Do not sleep soundly because of lung condition 3  Lots of Energy -> No energy at all 3  TOTAL Score (max 40)  24  Simple office walk 185 feet x  3 laps goal with forehead probe 11/08/2019   O2 used ra  Number laps completed 3  Comments about pace moderte  Resting Pulse Ox/HR 100% and 66/min  Final Pulse Ox/HR 98% and 102/min  Desaturated </= 88% no  Desaturated <= 3% points no  Got Tachycardic >/= 90/min yes  Symptoms at end of test Mild dyspnea  Miscellaneous comments x   CT Chest data from date: 06/11/24  - personally visualized and independently interpreted : LDCT. NOT VISUALIZED - my findings are: as below   IMPRESSION: 1. Lung-RADS 2, benign appearance or behavior. Continue annual screening with low-dose chest CT without contrast in 12 months. 2. Age advanced left anterior descending coronary artery calcification. 3.  Aortic atherosclerosis (ICD10-I70.0). 4.  Emphysema (ICD10-J43.9).     Electronically Signed   By: Newell Eke M.D.   On: 06/18/2024 08:44     Latest Reference Range & Units 03/05/12 17:17 12/24/23 19:35  Eosinophils Absolute 0.0 - 0.5 K/uL 0.2 0.4   PFT     Latest Ref Rng & Units 07/09/2024    8:40 AM 07/27/2021    1:53 PM 02/28/2020    8:48 AM  PFT Results  FVC-Pre L 2.33  P 2.61  2.35   FVC-Predicted Pre % 61  P 67  59   FVC-Post L 2.55  P  2.53   FVC-Predicted Post % 67  P  64   Pre FEV1/FVC % % 49  P 52  52   Post FEV1/FCV % % 46  P  48   FEV1-Pre L 1.13  P 1.36  1.22   FEV1-Predicted Pre % 38  P 45  40   FEV1-Post L 1.17  P  1.20   DLCO uncorrected ml/min/mmHg 8.27  P 8.56  9.55   DLCO UNC% % 35  P 36  40   DLCO corrected ml/min/mmHg  8.56  9.55   DLCO COR %Predicted %  36  40   DLVA Predicted % 41  P 43  47   TLC L 7.96  P  6.79   TLC % Predicted % 138  P  118   RV % Predicted % 233  P  181     P  Preliminary result       LAB RESULTS last 96 hours No results found.       has a past medical history of Raynaud disease and Tobacco use.   reports that she has been smoking cigarettes. She has a 60 pack-year smoking history. She has never used smokeless tobacco.  Past Surgical History:  Procedure Laterality Date   CESAREAN SECTION     IR RADIOLOGIST EVAL & MGMT  09/10/2016    Allergies[1]   There is no immunization history on file for this patient.  Family History  Problem Relation Age of Onset   Pancreatic cancer Brother    Breast cancer Niece 34    Current Medications[2]      Objective:   Vitals:   07/16/24 0827  BP: 90/60  Pulse: 70  SpO2: 98%  Weight: 113 lb 12.8 oz (51.6 kg)  Height: 5' 8 (1.727 m)    Estimated body mass index is 17.3 kg/m as calculated from the following:   Height as of this encounter: 5' 8 (1.727 m).   Weight as of this encounter: 113 lb 12.8 oz (51.6 kg).  @WEIGHTCHANGE @  Filed Weights   07/16/24 0827  Weight: 113 lb 12.8  oz (51.6 kg)     Physical Exam   General: No distress. Lean. Looks stabl O2 at rest: no Cane present: no Sitting in wheel chair: no Frail: no Obese: no Neuro: Alert and Oriented x 3. GCS 15. Speech normal Psych: Pleasant Resp:  Barrel Chest - yes mild.  Wheeze - no, Crackles - no, No overt respiratory distress CVS: Normal heart sounds. Murmurs - no Ext: Stigmata of Connective Tissue Disease - no HEENT: Normal upper airway. PEERL +. No post nasal drip        Assessment/     Assessment & Plan Stage 3 severe COPD by GOLD classification (HCC)  Tobacco abuse  Screening for lung cancer  Vaccine counseling    PLAN Patient Instructions  Stage 3 severe COPD by GOLD classification (HCC)  - stable on PFT and symptoms but severe disease and slowly progerssive symptoms. Just not in flare up. Fev1 stable over tie  Plan  - STOP stiolto  - start Breztri  2 puff twice daily - continue  albuterol  as needed  Tobacco abuse  Too bad unable to quit  Plan  - will power for now  Lung cancer screen  - no cancer on CT Dec 2025  Plan  - next LDCT Dec 2026  Vaccine counseling  - respect deferral of flu and covid vaccine and pneumonia vaccine  Followup 3 months with app and then based on symptoms consider OThuvayre or biologic    FOLLOWUP    Return for 3 months with app.    SIGNATURE    Dr. Dorethia Cave, M.D., F.C.C.P,  Pulmonary and Critical Care Medicine Staff Physician, [facility] Director - Interstitial Lung Disease  Program  Pulmonary Fibrosis [facility] at [facility] [place], [place], [address]  Pager: [phone], If no answer or between  15:00h - 7:00h: call [phone] Telephone: [phone]  8:47 AM 07/16/2024     [1]  Allergies Allergen Reactions   Fluoxetine Other (See Comments)    Wigged me out  [2]  Current Outpatient Medications:    albuterol  (VENTOLIN  HFA) 108 (90 Base) MCG/ACT inhaler, Inhale 2 puffs into the lungs every 6 (six) hours as needed for wheezing or shortness of breath., Disp: 3 each, Rfl: 3   alendronate (FOSAMAX) 70 MG tablet, Take 70 mg by mouth once a week. Take with a full glass of water on an empty stomach., Disp: , Rfl:    amLODipine  (NORVASC ) 2.5 MG tablet, Take 1 tablet (2.5 mg total) by mouth daily., Disp: 90 tablet, Rfl: 2   aspirin  EC 81 MG tablet, Take 1 tablet (81 mg total) by mouth daily., Disp: , Rfl:    atorvastatin  (LIPITOR) 80 MG tablet, Take 1 tablet (80 mg total) by mouth daily., Disp: 90 tablet, Rfl: 1   clonazePAM (KLONOPIN) 0.5 MG tablet, Take 0.5 mg by mouth as needed for anxiety., Disp: , Rfl:    DULoxetine (CYMBALTA) 60 MG capsule, Take 60 mg by mouth daily., Disp: , Rfl:    hydroxychloroquine (PLAQUENIL) 200 MG tablet, Take 200 mg by mouth at bedtime., Disp: , Rfl:    ipratropium-albuterol  (DUONEB) 0.5-2.5 (3) MG/3ML SOLN, Take 3 mLs by  nebulization every 6 (six) hours as needed., Disp: 360 mL, Rfl: 1   Nebulizers (COMPRESSOR/NEBULIZER) MISC, 1 each by Does not apply route every 6 (six) hours as needed., Disp: 1 each, Rfl: 0   Tiotropium Bromide-Olodaterol (STIOLTO RESPIMAT ) 2.5-2.5 MCG/ACT AERS, Inhale 2  puffs into the lungs daily., Disp: 4 g, Rfl: 11  "

## 2024-07-16 NOTE — Addendum Note (Signed)
 Addended by: Emre Stock M on: 07/16/2024 09:05 AM   Modules accepted: Orders

## 2024-07-23 ENCOUNTER — Telehealth: Payer: Self-pay

## 2024-07-23 MED ORDER — BREZTRI AEROSPHERE 160-9-4.8 MCG/ACT IN AERO: 2.0000 | INHALATION_SPRAY | Freq: Two times a day (BID) | RESPIRATORY_TRACT | 3 refills | Status: AC

## 2024-07-23 NOTE — Telephone Encounter (Signed)
 Copied from CRM [id number]. Topic: Clinical - Medication Refill >> Jul 23, 2024  7:50 AM Russell PARAS wrote: Medication:   budesonide -glycopyrrolate-formoterol (BREZTRI  AEROSPHERE) 160-9-4.8 MCG/ACT AERO inhaler   Has the patient contacted their pharmacy? Yes (Agent: If no, request that the patient contact the pharmacy for the refill. If patient does not wish to contact the pharmacy document the reason why and proceed with request.) (Agent: If yes, when and what did the pharmacy advise?)  This is the patient's preferred pharmacy:  [id number] [place], [place] - [address] [place] [address] Phone: [phone] Fax: [phone]  Is this the correct pharmacy for this prescription? Yes If no, delete pharmacy and type the correct one.   Has the prescription been filled recently? Yes  Is the patient out of the medication? No  Has the patient been seen for an appointment in the last year OR does the patient have an upcoming appointment? Yes, 1/9 w/Ramaswamy  Can we respond through MyChart? Yes Agent: Please be advised that Rx refills may take up to 3 business days. We ask that you follow-up with your pharmacy.   Pt was started on breztri  samples 07/16/2024. Pt states her breathing is doing well since starting on Breztri .  She still has samples left but would like rx.  Rx sent to pharmacy.

## 2024-08-20 ENCOUNTER — Ambulatory Visit: Admitting: Emergency Medicine

## 2024-10-15 ENCOUNTER — Ambulatory Visit: Admitting: Adult Health
